# Patient Record
Sex: Female | Born: 1990 | Race: Black or African American | Hispanic: No | Marital: Single | Smoking: Never smoker
Health system: Southern US, Community
[De-identification: ages and names within clinical notes are randomized; demographics above are authoritative.]

## PROBLEM LIST (undated history)

## (undated) DIAGNOSIS — O24419 Gestational diabetes mellitus in pregnancy, unspecified control: Secondary | ICD-10-CM

## (undated) HISTORY — PX: TUBAL LIGATION: SHX77

---

## 2017-12-15 ENCOUNTER — Encounter: Payer: Self-pay | Admitting: Family Medicine

## 2017-12-15 ENCOUNTER — Ambulatory Visit (INDEPENDENT_AMBULATORY_CARE_PROVIDER_SITE_OTHER): Payer: Medicaid Other

## 2017-12-15 VITALS — Wt 208.6 lb

## 2017-12-15 DIAGNOSIS — Z3200 Encounter for pregnancy test, result unknown: Secondary | ICD-10-CM

## 2017-12-15 DIAGNOSIS — Z3201 Encounter for pregnancy test, result positive: Secondary | ICD-10-CM

## 2017-12-15 NOTE — Progress Notes (Addendum)
Pt is here for a UPT. Has not taken one at home , UPT Positive in office LMP 09/23/17, EDD 06/30/2018 [id number] Pt reports taking no medicines or vitamins, Advised to start prenatal vitamins and OB care , Pt verbalized understanding. Pt had no questions

## 2017-12-18 LAB — POCT PREGNANCY, URINE: PREG TEST UR: POSITIVE — AB

## 2017-12-26 ENCOUNTER — Ambulatory Visit (INDEPENDENT_AMBULATORY_CARE_PROVIDER_SITE_OTHER): Payer: Medicaid Other | Admitting: Advanced Practice Midwife

## 2017-12-26 ENCOUNTER — Encounter: Payer: Self-pay | Admitting: Advanced Practice Midwife

## 2017-12-26 ENCOUNTER — Other Ambulatory Visit (HOSPITAL_COMMUNITY)
Admission: RE | Admit: 2017-12-26 | Discharge: 2017-12-26 | Disposition: A | Discharge status: Home / Self Care | Payer: Medicaid Other | Source: Ambulatory Visit | Attending: Advanced Practice Midwife | Admitting: Advanced Practice Midwife

## 2017-12-26 ENCOUNTER — Ambulatory Visit: Payer: Medicaid Other | Admitting: Clinical

## 2017-12-26 ENCOUNTER — Ambulatory Visit: Payer: Self-pay

## 2017-12-26 VITALS — BP 119/70 | HR 98 | Wt 208.0 lb

## 2017-12-26 DIAGNOSIS — Z3480 Encounter for supervision of other normal pregnancy, unspecified trimester: Secondary | ICD-10-CM

## 2017-12-26 DIAGNOSIS — Z98891 History of uterine scar from previous surgery: Secondary | ICD-10-CM | POA: Insufficient documentation

## 2017-12-26 DIAGNOSIS — O26849 Uterine size-date discrepancy, unspecified trimester: Secondary | ICD-10-CM

## 2017-12-26 DIAGNOSIS — O26841 Uterine size-date discrepancy, first trimester: Secondary | ICD-10-CM

## 2017-12-26 DIAGNOSIS — Z3482 Encounter for supervision of other normal pregnancy, second trimester: Secondary | ICD-10-CM | POA: Insufficient documentation

## 2017-12-26 DIAGNOSIS — Z3481 Encounter for supervision of other normal pregnancy, first trimester: Secondary | ICD-10-CM

## 2017-12-26 NOTE — Progress Notes (Signed)
Subjective:   Brooke Gregory is a 27 y.o. G3P2002 at 16 weeks 5 days by midtrimester ultrasound being seen today for her first obstetrical visit.  Her obstetrical history is significant for previous c-section x 2, GDM with prior pregnancy . Patient does intend to breast feed. Pregnancy history fully reviewed.  Patient reports no complaints.  HISTORY: OB History  Gravida Para Term Preterm AB Living  3 2 2  0 0 2  SAB TAB Ectopic Multiple Live Births  0 0 0 0 2    # Outcome Date GA Lbr Len/2nd Weight Sex Delivery Anes PTL Lv  3 Current           2 Term 06/27/16 [redacted]w[redacted]d  10 lb 5 oz (4.678 kg) F CS-LTranv   LIV     Name: Brooke Gregory   1 Term 10/09/07 [redacted]w[redacted]d  8 lb (3.629 kg) M CS-LTranv EPI  LIV     Name: Brooke Gregory     Last pap smear was done unsure, approx 2+years ago and was normal per patient   History reviewed. No pertinent past medical history. Past Surgical History:  Procedure Laterality Date  . CESAREAN SECTION     History reviewed. No pertinent family history. Social History   Tobacco Use  . Smoking status: Never Smoker  . Smokeless tobacco: Never Used  Substance Use Topics  . Alcohol use: Never    Frequency: Never  . Drug use: Never   Not on File Current Outpatient Medications on File Prior to Visit  Medication Sig Dispense Refill  . Prenatal Vit-Fe Fumarate-FA (MULTIVITAMIN-PRENATAL) 27-0.8 MG TABS tablet Take 1 tablet by mouth daily at 12 noon.     No current facility-administered medications on file prior to visit.     Review of Systems Pertinent items noted in HPI and remainder of comprehensive ROS otherwise negative.  Exam   Vitals:   12/26/17 1105  BP: 119/70  Pulse: 98  Weight: 208 lb (94.3 kg)   Fetal Heart Rate (bpm): 158  Uterus:     Pelvic Exam: Perineum: no hemorrhoids, normal perineum   Vulva: normal external genitalia, no lesions   Vagina:  normal mucosa, normal discharge   Cervix: no lesions and normal, pap smear  done.    Adnexa: normal adnexa and no mass, fullness, tenderness   Bony Pelvis: average  System: General: well-developed, well-nourished female in no acute distress   Breast:  normal appearance, no masses or tenderness   Skin: normal coloration and turgor, no rashes   Neurologic: oriented, normal, negative, normal mood   Extremities: normal strength, tone, and muscle mass, ROM of all joints is normal   HEENT PERRLA, extraocular movement intact and sclera clear, anicteric   Mouth/Teeth mucous membranes moist, pharynx normal without lesions and dental hygiene good   Neck supple and no masses   Cardiovascular: regular rate and rhythm   Respiratory:  no respiratory distress, normal breath sounds   Abdomen: soft, non-tender; bowel sounds normal; no masses,  no organomegaly    Assessment:   Pregnancy: [id number] Patient Active Problem List   Diagnosis Date Noted  . Supervision of other normal pregnancy, antepartum 12/26/2017  . Previous cesarean section x 2 12/26/2017     Plan:  1. Supervision of other normal pregnancy, antepartum - Routine care  - Obstetric Panel, Including HIV - Genetic Screening - Cystic Fibrosis Mutation 97 - SMN1 Copy Number Analysis - Hemoglobinopathy Evaluation - Cytology - PAP - Culture, OB Urine - [place] MFM  OB COMP + 14 WK; Future - HgB A1c - AFP only   2. Significant discrepancy between uterine size and clinical dates, antepartum - US OB Limited; Future  3. Previous cesarean section x 2 - Plan repeat    Initial labs drawn. Continue prenatal vitamins. Genetic Screening discussed, AFP and NIPS: requested. Ultrasound discussed; fetal anatomic survey: ordered. Problem list reviewed and updated. The nature of [facility] with multiple MDs and other Advanced Practice Providers was explained to patient; also emphasized that residents, students are part of our team. Routine obstetric precautions reviewed. 50% of 45 min  visit spent in counseling and coordination of care. Return in about 4 weeks (around 01/23/2018).

## 2017-12-26 NOTE — Patient Instructions (Signed)
   Genetic Screening Results Information: You are having genetic testing called [facility] today.  It will take approximately 2 weeks before the results are available.  To get your results, you need Internet access to a web browser to search [place]/MyChart (the direct app on your phone will not give you these results).  Then select Lab Scanned and click on the blue hyper link that says View Image to see your [facility] results.  You can also use the directions on the purple card given to look up your results directly on the [facility] website.  

## 2017-12-26 NOTE — BH Specialist Note (Signed)
Integrated Behavioral Health Initial Visit  MRN: [id number] Name: Brooke Gregory  Number of Integrated Behavioral Health Clinician visits:: 1/6 Session Start time: 10:24  Session End time: 10:33 Total time: 8 minutes  Type of Service: Integrated Behavioral Health- Individual/Family Interpretor:No. Interpretor Name and Language: n/a   Warm Hand Off Completed.       SUBJECTIVE: Brooke Gregory is a 27 y.o. female accompanied by n/a Patient was referred by Thressa Sheller, CNM for Initial OB introduction to integrated behavioral health services . Patient reports the following symptoms/concerns: Pt states no particular concern today. Duration of problem: n/a; Severity of problem: n/a  OBJECTIVE: Mood: Normal and Affect: Appropriate Risk of harm to self or others: No plan to harm self or others  LIFE CONTEXT: Family and Social: Pt has 28yo School/Work: - Self-Care: - Life Changes: Current pregnancy  GOALS ADDRESSED: n/a  INTERVENTIONS:   Standardized Assessments completed: GAD-7 and PHQ 9  ASSESSMENT: Patient currently experiencing Supervision of other normal pregnancy, antepartum.   Patient may benefit from Initial OB introduction to integrated behavioral health services .  PLAN: 1. Follow up with behavioral health clinician on : As needed 2. Behavioral recommendations:  -Continue taking prenatal vitamin as recommended by medical provider 3. Referral(s): Integrated [employer] (In Clinic) 4. "From scale of 1-10, how likely are you to follow plan?": 10  Rae Lips, LCSW  Depression screen [facility] 2/9 12/26/2017  Decreased Interest 1  Down, Depressed, Hopeless 1  PHQ - 2 Score 2  Altered sleeping 0  Tired, decreased energy 1  Change in appetite 2  Feeling bad or failure about yourself  1  Trouble concentrating 0  Moving slowly or fidgety/restless 0  Suicidal thoughts 0  PHQ-9 Score 6   GAD 7 : Generalized Anxiety Score 12/26/2017  Nervous,  Anxious, on Edge 0  Control/stop worrying 0  Worry too much - different things 0  Trouble relaxing 0  Restless 0  Afraid - awful might happen 0

## 2017-12-28 LAB — URINE CULTURE, OB REFLEX

## 2017-12-28 LAB — CULTURE, OB URINE

## 2017-12-29 LAB — CYTOLOGY - PAP
CHLAMYDIA, DNA PROBE: NEGATIVE
Diagnosis: NEGATIVE
Neisseria Gonorrhea: NEGATIVE

## 2017-12-29 MED ORDER — NITROFURANTOIN MONOHYD MACRO 100 MG PO CAPS: 100.0000 mg | ORAL_CAPSULE | Freq: Two times a day (BID) | ORAL | 0 refills | Status: DC

## 2017-12-29 NOTE — Addendum Note (Signed)
Addended by: Thressa Sheller D on: 12/29/2017 09:30 PM   Modules accepted: Orders

## 2018-01-01 ENCOUNTER — Encounter: Payer: Self-pay | Admitting: *Deleted

## 2018-01-02 ENCOUNTER — Telehealth: Payer: Self-pay

## 2018-01-02 NOTE — Telephone Encounter (Signed)
LM for pt that she has a UTI and that an antibiotic has been sent to her [facility] pharmacy on [address]. And we also sent a MyChart message.

## 2018-01-02 NOTE — Telephone Encounter (Signed)
-----   Message from Brooke Gregory, CNM sent at 12/29/2017  9:29 PM EDT ----- Patient has UTI. I have sent in rx for her. Please call.

## 2018-01-03 LAB — HEMOGLOBINOPATHY EVALUATION
Ferritin: 36 ng/mL (ref 15–150)
HGB A2 QUANT: 2.5 % (ref 1.8–3.2)
HGB S: 0 %
HGB SOLUBILITY: NEGATIVE
HGB VARIANT: 0 %
Hgb A: 97.5 % (ref 96.4–98.8)
Hgb C: 0 %
Hgb F Quant: 0 % (ref 0.0–2.0)

## 2018-01-03 LAB — OBSTETRIC PANEL, INCLUDING HIV
ANTIBODY SCREEN: NEGATIVE
BASOS: 0 %
Basophils Absolute: 0 10*3/uL (ref 0.0–0.2)
EOS (ABSOLUTE): 0.2 10*3/uL (ref 0.0–0.4)
EOS: 2 %
HEMATOCRIT: 32.6 % — AB (ref 34.0–46.6)
HEMOGLOBIN: 10.7 g/dL — AB (ref 11.1–15.9)
HEP B S AG: NEGATIVE
HIV Screen 4th Generation wRfx: NONREACTIVE
IMMATURE GRANULOCYTES: 0 %
Immature Grans (Abs): 0 10*3/uL (ref 0.0–0.1)
LYMPHS ABS: 2.2 10*3/uL (ref 0.7–3.1)
Lymphs: 20 %
MCH: 27 pg (ref 26.6–33.0)
MCHC: 32.8 g/dL (ref 31.5–35.7)
MCV: 82 fL (ref 79–97)
Monocytes Absolute: 0.6 10*3/uL (ref 0.1–0.9)
Monocytes: 6 %
NEUTROS PCT: 72 %
Neutrophils Absolute: 8.1 10*3/uL — ABNORMAL HIGH (ref 1.4–7.0)
Platelets: 401 10*3/uL (ref 150–450)
RBC: 3.96 x10E6/uL (ref 3.77–5.28)
RDW: 15.5 % — ABNORMAL HIGH (ref 12.3–15.4)
RH TYPE: POSITIVE
RPR: NONREACTIVE
Rubella Antibodies, IGG: 1.98 index (ref >0.99)
WBC: 11.3 10*3/uL — ABNORMAL HIGH (ref 3.4–10.8)

## 2018-01-03 LAB — SMN1 COPY NUMBER ANALYSIS (SMA CARRIER SCREENING)

## 2018-01-03 LAB — AFP, SERUM, OPEN SPINA BIFIDA
AFP MOM: 1.11
AFP VALUE AFPOSL: 35.4 ng/mL
Gest. Age on Collection Date: 16.7 weeks
Maternal Age At EDD: 28.2 yr
OSBR RISK 1 IN: 10000
Test Results:: NEGATIVE
WEIGHT: 208 [lb_av]

## 2018-01-03 LAB — CYSTIC FIBROSIS MUTATION 97: Interpretation: NOT DETECTED

## 2018-01-03 LAB — HEMOGLOBIN A1C
Est. average glucose Bld gHb Est-mCnc: 114 mg/dL
Hgb A1c MFr Bld: 5.6 % (ref 4.8–5.6)

## 2018-01-05 ENCOUNTER — Encounter (HOSPITAL_COMMUNITY): Payer: Self-pay

## 2018-01-11 ENCOUNTER — Ambulatory Visit (HOSPITAL_COMMUNITY)
Admission: RE | Admit: 2018-01-11 | Discharge: 2018-01-11 | Disposition: A | Discharge status: Home / Self Care | Payer: Medicaid Other | Source: Ambulatory Visit | Attending: Advanced Practice Midwife | Admitting: Advanced Practice Midwife

## 2018-01-11 ENCOUNTER — Other Ambulatory Visit: Payer: Self-pay | Admitting: Advanced Practice Midwife

## 2018-01-11 ENCOUNTER — Other Ambulatory Visit (HOSPITAL_COMMUNITY): Payer: Self-pay | Admitting: *Deleted

## 2018-01-11 DIAGNOSIS — O99212 Obesity complicating pregnancy, second trimester: Secondary | ICD-10-CM

## 2018-01-11 DIAGNOSIS — Z363 Encounter for antenatal screening for malformations: Secondary | ICD-10-CM | POA: Diagnosis present

## 2018-01-11 DIAGNOSIS — Z3480 Encounter for supervision of other normal pregnancy, unspecified trimester: Secondary | ICD-10-CM

## 2018-01-11 DIAGNOSIS — O34219 Maternal care for unspecified type scar from previous cesarean delivery: Secondary | ICD-10-CM | POA: Diagnosis not present

## 2018-01-11 DIAGNOSIS — O09292 Supervision of pregnancy with other poor reproductive or obstetric history, second trimester: Secondary | ICD-10-CM

## 2018-01-11 DIAGNOSIS — Z362 Encounter for other antenatal screening follow-up: Secondary | ICD-10-CM

## 2018-01-11 DIAGNOSIS — Z3A18 18 weeks gestation of pregnancy: Secondary | ICD-10-CM

## 2018-01-23 ENCOUNTER — Encounter: Payer: Medicaid Other | Admitting: Advanced Practice Midwife

## 2018-02-06 ENCOUNTER — Ambulatory Visit (INDEPENDENT_AMBULATORY_CARE_PROVIDER_SITE_OTHER): Payer: Medicaid Other | Admitting: Advanced Practice Midwife

## 2018-02-06 VITALS — BP 128/82 | HR 99 | Wt 215.2 lb

## 2018-02-06 DIAGNOSIS — Z3480 Encounter for supervision of other normal pregnancy, unspecified trimester: Secondary | ICD-10-CM

## 2018-02-06 DIAGNOSIS — R102 Pelvic and perineal pain: Secondary | ICD-10-CM

## 2018-02-06 DIAGNOSIS — Z3482 Encounter for supervision of other normal pregnancy, second trimester: Secondary | ICD-10-CM | POA: Diagnosis not present

## 2018-02-06 DIAGNOSIS — O26892 Other specified pregnancy related conditions, second trimester: Secondary | ICD-10-CM

## 2018-02-06 DIAGNOSIS — Z98891 History of uterine scar from previous surgery: Secondary | ICD-10-CM

## 2018-02-06 MED ORDER — COMFORT FIT MATERNITY SUPP MED MISC: 1.0000 | Freq: Every day | 0 refills | Status: DC

## 2018-02-06 NOTE — Progress Notes (Signed)
PRENATAL VISIT NOTE  Subjective:  Brooke Gregory is a 27 y.o. G3P2002 at [redacted]w[redacted]d being seen today for ongoing prenatal care.  She is currently monitored for the following issues for this low-risk pregnancy and has Supervision of other normal pregnancy, antepartum and Previous cesarean section x 2 on their problem list.   Patient reports pelvic/hip pain. It occurs intermittently and seems to be worse when lying down. She has tried to use a pillow in between her legs to relieve some of the pain with little relief. This is her third pregnancy, with two prior C-sections. She denies leaking of fluid, vaginal bleeding, or contractions. She has had some movement of the baby.  The following portions of the patient's history were reviewed and updated as appropriate: allergies, current medications, past family history, past medical history, past social history, past surgical history and problem list. Problem list updated.  Objective:   Vitals:   02/06/18 1415  BP: 128/82  Pulse: 99  Weight: 215 lb 3.2 oz (97.6 kg)    Fetal Status: Fetal Heart Rate (bpm): 141   Movement: Present     General:  Alert, oriented and cooperative. Patient is in no acute distress.  Skin: Skin is warm and dry. No rash noted.   Cardiovascular: Normal heart rate noted  Respiratory: Normal respiratory effort, no problems with respiration noted  Abdomen: Soft, gravid, appropriate for gestational age.  Pain/Pressure: Present     Pelvic: Cervical exam deferred        Extremities: Normal range of motion.  Edema: None  Mental Status: Normal mood and affect. Normal behavior. Normal judgment and thought content.   Assessment and Plan:  Pregnancy: G3P2002 at [redacted]w[redacted]d  1. Supervision of other normal pregnancy, antepartum -Routine care, initial testing already completed  2. Previous cesarean section x 2 -Planned as before.  3. Pelvic pain affecting pregnancy in second trimester, antepartum - Elastic Bandages & Supports (COMFORT  FIT MATERNITY SUPP MED) MISC; 1 Device by Does not apply route daily.  Dispense: 1 each; Refill: 0 -Patient told to return if the pain becomes worse. Continue using the pillow in between her legs while sleeping. Use ice, heat, Tylenol as needed. Start wearing support belt.  Term labor symptoms and general obstetric precautions including but not limited to vaginal bleeding, contractions, leaking of fluid and fetal movement were reviewed in detail with the patient. Please refer to After Visit Summary for other counseling recommendations.  No follow-ups on file.  Future Appointments  Date Time Provider Department Center  02/08/2018  8:15 AM [facility]-M[facility] [place] 4 WH-MFCUS MFC-US  03/06/2018 11:15 AM Katrinka Blazing, [place], CNM WOC-WOCA WOC    [place], [place]

## 2018-02-06 NOTE — Patient Instructions (Addendum)
Second Trimester of Pregnancy The second trimester is from week 14 through week 27 (months 4 through 6). The second trimester is often a time when you feel your best. Your body has adjusted to being pregnant, and you begin to feel better physically. Usually, morning sickness has lessened or quit completely, you may have more energy, and you may have an increase in appetite. The second trimester is also a time when the fetus is growing rapidly. At the end of the sixth month, the fetus is about 9 inches long and weighs about 1 pounds. You will likely begin to feel the baby move (quickening) between 16 and 20 weeks of pregnancy. Body changes during your second trimester Your body continues to go through many changes during your second trimester. The changes vary from woman to woman.  Your weight will continue to increase. You will notice your lower abdomen bulging out.  You may begin to get stretch marks on your hips, abdomen, and breasts.  You may develop headaches that can be relieved by medicines. The medicines should be approved by your health care provider.  You may urinate more often because the fetus is pressing on your bladder.  You may develop or continue to have heartburn as a result of your pregnancy.  You may develop constipation because certain hormones are causing the muscles that push waste through your intestines to slow down.  You may develop hemorrhoids or swollen, bulging veins (varicose veins).  You may have back pain. This is caused by: ? Weight gain. ? Pregnancy hormones that are relaxing the joints in your pelvis. ? A shift in weight and the muscles that support your balance.  Your breasts will continue to grow and they will continue to become tender.  Your gums may bleed and may be sensitive to brushing and flossing.  Dark spots or blotches (chloasma, mask of pregnancy) may develop on your face. This will likely fade after the baby is born.  A dark line from your  belly button to the pubic area (linea nigra) may appear. This will likely fade after the baby is born.  You may have changes in your hair. These can include thickening of your hair, rapid growth, and changes in texture. Some women also have hair loss during or after pregnancy, or hair that feels dry or thin. Your hair will most likely return to normal after your baby is born.  What to expect at prenatal visits During a routine prenatal visit:  You will be weighed to make sure you and the fetus are growing normally.  Your blood pressure will be taken.  Your abdomen will be measured to track your baby's growth.  The fetal heartbeat will be listened to.  Any test results from the previous visit will be discussed.  Your health care provider may ask you:  How you are feeling.  If you are feeling the baby move.  If you have had any abnormal symptoms, such as leaking fluid, bleeding, severe headaches, or abdominal cramping.  If you are using any tobacco products, including cigarettes, chewing tobacco, and electronic cigarettes.  If you have any questions.  Other tests that may be performed during your second trimester include:  Blood tests that check for: ? Low iron levels (anemia). ? High blood sugar that affects pregnant women (gestational diabetes) between 24 and 28 weeks. ? Rh antibodies. This is to check for a protein on red blood cells (Rh factor).  Urine tests to check for infections, diabetes, or   protein in the urine.  An ultrasound to confirm the proper growth and development of the baby.  An amniocentesis to check for possible genetic problems.  Fetal screens for spina bifida and Down syndrome.  HIV (human immunodeficiency virus) testing. Routine prenatal testing includes screening for HIV, unless you choose not to have this test.  Follow these instructions at home: Medicines  Follow your health care provider's instructions regarding medicine use. Specific  medicines may be either safe or unsafe to take during pregnancy.  Take a prenatal vitamin that contains at least 600 micrograms (mcg) of folic acid.  If you develop constipation, try taking a stool softener if your health care provider approves. Eating and drinking  Eat a balanced diet that includes fresh fruits and vegetables, whole grains, good sources of protein such as meat, eggs, or tofu, and low-fat dairy. Your health care provider will help you determine the amount of weight gain that is right for you.  Avoid raw meat and uncooked cheese. These carry germs that can cause birth defects in the baby.  If you have low calcium intake from food, talk to your health care provider about whether you should take a daily calcium supplement.  Limit foods that are high in fat and processed sugars, such as fried and sweet foods.  To prevent constipation: ? Drink enough fluid to keep your urine clear or pale yellow. ? Eat foods that are high in fiber, such as fresh fruits and vegetables, whole grains, and beans. Activity  Exercise only as directed by your health care provider. Most women can continue their usual exercise routine during pregnancy. Try to exercise for 30 minutes at least 5 days a week. Stop exercising if you experience uterine contractions.  Avoid heavy lifting, wear low heel shoes, and practice good posture.  A sexual relationship may be continued unless your health care provider directs you otherwise. Relieving pain and discomfort  Wear a good support bra to prevent discomfort from breast tenderness.  Take warm sitz baths to soothe any pain or discomfort caused by hemorrhoids. Use hemorrhoid cream if your health care provider approves.  Rest with your legs elevated if you have leg cramps or low back pain.  If you develop varicose veins, wear support hose. Elevate your feet for 15 minutes, 3-4 times a day. Limit salt in your diet. Prenatal Care  Write down your questions.  Take them to your prenatal visits.  Keep all your prenatal visits as told by your health care provider. This is important. Safety  Wear your seat belt at all times when driving.  Make a list of emergency phone numbers, including numbers for family, friends, the hospital, and police and fire departments. General instructions  Ask your health care provider for a referral to a local prenatal education class. Begin classes no later than the beginning of month 6 of your pregnancy.  Ask for help if you have counseling or nutritional needs during pregnancy. Your health care provider can offer advice or refer you to specialists for help with various needs.  Do not use hot tubs, steam rooms, or saunas.  Do not douche or use tampons or scented sanitary pads.  Do not cross your legs for long periods of time.  Avoid cat litter boxes and soil used by cats. These carry germs that can cause birth defects in the baby and possibly loss of the fetus by miscarriage or stillbirth.  Avoid all smoking, herbs, alcohol, and unprescribed drugs. Chemicals in these products can   affect the formation and growth of the baby.  Do not use any products that contain nicotine or tobacco, such as cigarettes and e-cigarettes. If you need help quitting, ask your health care provider.  Visit your dentist if you have not gone yet during your pregnancy. Use a soft toothbrush to brush your teeth and be gentle when you floss. Contact a health care provider if:  You have dizziness.  You have mild pelvic cramps, pelvic pressure, or nagging pain in the abdominal area.  You have persistent nausea, vomiting, or diarrhea.  You have a bad smelling vaginal discharge.  You have pain when you urinate. Get help right away if:  You have a fever.  You are leaking fluid from your vagina.  You have spotting or bleeding from your vagina.  You have severe abdominal cramping or pain.  You have rapid weight gain or weight  loss.  You have shortness of breath with chest pain.  You notice sudden or extreme swelling of your face, hands, ankles, feet, or legs.  You have not felt your baby move in over an hour.  You have severe headaches that do not go away when you take medicine.  You have vision changes. Summary  The second trimester is from week 14 through week 27 (months 4 through 6). It is also a time when the fetus is growing rapidly.  Your body goes through many changes during pregnancy. The changes vary from woman to woman.  Avoid all smoking, herbs, alcohol, and unprescribed drugs. These chemicals affect the formation and growth your baby.  Do not use any tobacco products, such as cigarettes, chewing tobacco, and e-cigarettes. If you need help quitting, ask your health care provider.  Contact your health care provider if you have any questions. Keep all prenatal visits as told by your health care provider. This is important. This information is not intended to replace advice given to you by your health care provider. Make sure you discuss any questions you have with your health care provider. Document Released: 04/05/2001 Document Revised: 05/17/2016 Document Reviewed: 05/17/2016 Elsevier Interactive Patient Education  2018 [employer] Inc[employer]    PREGNANCY SUPPORT BELT: You are not alone, Seventy-five percent of women have some sort of abdominal or back pain at some point in their pregnancy. Your baby is growing at a fast pace, which means that your whole body is rapidly trying to adjust to the changes. As your uterus grows, your back may start feeling a bit under stress and this can result in back or abdominal pain that can go from mild, and therefore bearable, to severe pains that will not allow you to sit or lay down comfortably, When it comes to dealing with pregnancy-related pains and cramps, some pregnant women usually prefer natural remedies, which the market is filled with nowadays. For example,  wearing a pregnancy support belt can help ease and lessen your discomfort and pain. WHAT ARE THE BENEFITS OF WEARING A PREGNANCY SUPPORT BELT? A pregnancy support belt provides support to the lower portion of the belly taking some of the weight of the growing uterus and distributing to the other parts of your body. It is designed make you comfortable and gives you extra support. Over the years, the pregnancy apparel market has been studying the needs and wants of pregnant women and they have come up with the most comfortable pregnancy support belts that woman could ever ask for. In fact, you will no longer have to wear a stretched-out or bulky   pregnancy belt that is visible underneath your clothes and makes you feel even more uncomfortable. Nowadays, a pregnancy support belt is made of comfortable and stretchy materials that will not irritate your skin but will actually make you feel at ease and you will not even notice you are wearing it. They are easy to put on and adjust during the day and can be worn at night for additional support.  BENEFITS: . Relives Back pain . Relieves Abdominal Muscle and Leg Pain . Stabilizes the Pelvic Ring . Offers a Cushioned Abdominal Lift Pad . Relieves pressure on the Sciatic Nerve Within Minutes WHERE TO GET YOUR PREGNANCY BELT: [employer] [phone] @[address] [place], [place] [address]  

## 2018-02-06 NOTE — Progress Notes (Signed)
   PRENATAL VISIT NOTE  Subjective:  Brooke Gregory is a 27 y.o. G3P2002 at [redacted]w[redacted]d being seen today for ongoing prenatal care.  She is currently monitored for the following issues for this low-risk pregnancy and has Supervision of other normal pregnancy, antepartum and Previous cesarean section x 2 on their problem list.  Patient reports no complaints.  Contractions: Not present. Vag. Bleeding: None.  Movement: Present. Denies leaking of fluid.   The following portions of the patient's history were reviewed and updated as appropriate: allergies, current medications, past family history, past medical history, past social history, past surgical history and problem list. Problem list updated.  Objective:   Vitals:   02/06/18 1415  BP: 128/82  Pulse: 99  Weight: 97.6 kg    Fetal Status: Fetal Heart Rate (bpm): 141   Movement: Present     General:  Alert, oriented and cooperative. Patient is in no acute distress.  Skin: Skin is warm and dry. No rash noted.   Cardiovascular: Normal heart rate noted  Respiratory: Normal respiratory effort, no problems with respiration noted  Abdomen: Soft, gravid, appropriate for gestational age.  Pain/Pressure: Present     Pelvic: Cervical exam deferred        Extremities: Normal range of motion.  Edema: None  Mental Status: Normal mood and affect. Normal behavior. Normal judgment and thought content.   Assessment and Plan:  Pregnancy: G3P2002 at [redacted]w[redacted]d  1. Supervision of other normal pregnancy, antepartum --Anticipatory guidance about next visits/weeks of pregnancy given.  2. Previous cesarean section x 2 --Plans repeat  3. Pelvic pain affecting pregnancy in second trimester, antepartum --Pain c/w round ligament pain and pubic symphysis pain.   --Rest/ice/heat/warm bath/Tylenol/Pregnancy support belt - Elastic Bandages & Supports (COMFORT FIT MATERNITY SUPP MED) MISC; 1 Device by Does not apply route daily.  Dispense: 1 each; Refill: 0  Preterm  labor symptoms and general obstetric precautions including but not limited to vaginal bleeding, contractions, leaking of fluid and fetal movement were reviewed in detail with the patient. Please refer to After Visit Summary for other counseling recommendations.  No follow-ups on file.  Future Appointments  Date Time Provider Department Center  02/08/2018  8:15 AM WH-MFC [place] 4 WH-MFCUS MFC-US  03/06/2018 11:15 AM Katrinka Blazing, [place], CNM WOC-WOCA WOC    [place]aren Counter, [place]

## 2018-02-06 NOTE — Progress Notes (Signed)
Pt states is having a lot of pelvic pain. 

## 2018-02-08 ENCOUNTER — Ambulatory Visit (HOSPITAL_COMMUNITY): Payer: Medicaid Other

## 2018-02-22 ENCOUNTER — Ambulatory Visit (HOSPITAL_COMMUNITY): Payer: Medicaid Other

## 2018-02-28 ENCOUNTER — Ambulatory Visit (HOSPITAL_COMMUNITY)
Admission: RE | Admit: 2018-02-28 | Discharge: 2018-02-28 | Disposition: A | Discharge status: Home / Self Care | Payer: Medicaid Other | Source: Ambulatory Visit | Attending: Advanced Practice Midwife | Admitting: Advanced Practice Midwife

## 2018-02-28 DIAGNOSIS — Z3A25 25 weeks gestation of pregnancy: Secondary | ICD-10-CM | POA: Diagnosis not present

## 2018-02-28 DIAGNOSIS — Z362 Encounter for other antenatal screening follow-up: Secondary | ICD-10-CM | POA: Diagnosis not present

## 2018-02-28 DIAGNOSIS — O99212 Obesity complicating pregnancy, second trimester: Secondary | ICD-10-CM | POA: Insufficient documentation

## 2018-02-28 DIAGNOSIS — O99512 Diseases of the respiratory system complicating pregnancy, second trimester: Secondary | ICD-10-CM

## 2018-02-28 DIAGNOSIS — O34219 Maternal care for unspecified type scar from previous cesarean delivery: Secondary | ICD-10-CM | POA: Diagnosis not present

## 2018-02-28 DIAGNOSIS — O09292 Supervision of pregnancy with other poor reproductive or obstetric history, second trimester: Secondary | ICD-10-CM

## 2018-03-06 ENCOUNTER — Ambulatory Visit (INDEPENDENT_AMBULATORY_CARE_PROVIDER_SITE_OTHER): Payer: Medicaid Other | Admitting: Student

## 2018-03-06 ENCOUNTER — Encounter: Payer: Medicaid Other | Admitting: Advanced Practice Midwife

## 2018-03-06 VITALS — BP 124/81 | HR 103 | Ht 61.0 in | Wt 218.0 lb

## 2018-03-06 DIAGNOSIS — Z3480 Encounter for supervision of other normal pregnancy, unspecified trimester: Secondary | ICD-10-CM

## 2018-03-06 DIAGNOSIS — N390 Urinary tract infection, site not specified: Secondary | ICD-10-CM | POA: Insufficient documentation

## 2018-03-06 DIAGNOSIS — Z8632 Personal history of gestational diabetes: Secondary | ICD-10-CM

## 2018-03-06 DIAGNOSIS — O09299 Supervision of pregnancy with other poor reproductive or obstetric history, unspecified trimester: Secondary | ICD-10-CM

## 2018-03-06 DIAGNOSIS — N3000 Acute cystitis without hematuria: Secondary | ICD-10-CM

## 2018-03-06 DIAGNOSIS — Z98891 History of uterine scar from previous surgery: Secondary | ICD-10-CM

## 2018-03-06 MED ORDER — FERROUS SULFATE 325 (65 FE) MG PO TABS: 325.0000 mg | ORAL_TABLET | Freq: Every day | ORAL | 3 refills | Status: DC

## 2018-03-06 NOTE — Progress Notes (Signed)
    PRENATAL VISIT NOTE  Subjective:  Brooke Gregory is a 27 y.o. G3P2002 at [redacted]w[redacted]d being seen today for ongoing prenatal care.  She is currently monitored for the following issues for this low-risk pregnancy and has Supervision of other normal pregnancy, antepartum; History of C-section; UTI (urinary tract infection); and History of macrosomia in infant in prior pregnancy, currently pregnant on their problem list.  Patient reports no complaints.  Contractions: Not present. Vag. Bleeding: None.  Movement: Present. Denies leaking of fluid.   The following portions of the patient's history were reviewed and updated as appropriate: allergies, current medications, past family history, past medical history, past social history, past surgical history and problem list. Problem list updated.  Objective:   Vitals:   03/06/18 1120 03/06/18 1124  BP: 124/81   Pulse: (!) 103   Weight: 218 lb (98.9 kg)   Height:  5\' 1"  (1.549 m)    Fetal Status: Fetal Heart Rate (bpm): 144 Fundal Height: 30 cm Movement: Present     General:  Alert, oriented and cooperative. Patient is in no acute distress.  Skin: Skin is warm and dry. No rash noted.   Cardiovascular: Normal heart rate noted  Respiratory: Normal respiratory effort, no problems with respiration noted  Abdomen: Soft, gravid, appropriate for gestational age.  Pain/Pressure: Present     Pelvic: Cervical exam deferred        Extremities: Normal range of motion.  Edema: None  Mental Status: Normal mood and affect. Normal behavior. Normal judgment and thought content.   Assessment and Plan:  Pregnancy: G3P2002 at [redacted]w[redacted]d  1. History of gestational diabetes -2 hour next visit  2. Acute cystitis without hematuria -Patient completed course of abx; no s/s; will do follow up culture today - Culture, OB Urine  3. History of macrosomia in infant in prior pregnancy, currently pregnant -consider growth scan if FH is large; measures 30 cm today but  difficult to assess without pannus.  -last [place] on 11-6 was EFW in 68%  4. History of C-section -plans repeat  5. Supervision of other normal pregnancy, antepartum -reviewed labs; patient to start iron once a day, gave education on how to take and side effects   Preterm labor symptoms and general obstetric precautions including but not limited to vaginal bleeding, contractions, leaking of fluid and fetal movement were reviewed in detail with the patient. Please refer to After Visit Summary for other counseling recommendations.  Return in about 2 weeks (around 03/20/2018), or 2 hour gtt.  Future Appointments  Date Time Provider Department Center  03/20/2018  8:35 AM Brooke Gregory, [place] [facility]  03/20/2018  9:30 AM [facility]    [place]oke [place], [place]

## 2018-03-06 NOTE — Patient Instructions (Signed)

## 2018-03-11 LAB — CULTURE, OB URINE

## 2018-03-11 LAB — URINE CULTURE, OB REFLEX

## 2018-03-13 ENCOUNTER — Other Ambulatory Visit: Payer: Self-pay | Admitting: Student

## 2018-03-13 ENCOUNTER — Encounter: Payer: Self-pay | Admitting: Student

## 2018-03-13 DIAGNOSIS — R8271 Bacteriuria: Secondary | ICD-10-CM

## 2018-03-13 DIAGNOSIS — A491 Streptococcal infection, unspecified site: Secondary | ICD-10-CM | POA: Insufficient documentation

## 2018-03-13 DIAGNOSIS — N3000 Acute cystitis without hematuria: Secondary | ICD-10-CM

## 2018-03-13 MED ORDER — AMOXICILLIN 875 MG PO TABS: 875.0000 mg | ORAL_TABLET | Freq: Two times a day (BID) | ORAL | 0 refills | Status: DC

## 2018-03-19 ENCOUNTER — Other Ambulatory Visit: Payer: Self-pay | Admitting: *Deleted

## 2018-03-19 DIAGNOSIS — Z3480 Encounter for supervision of other normal pregnancy, unspecified trimester: Secondary | ICD-10-CM

## 2018-03-20 ENCOUNTER — Encounter: Payer: Medicaid Other | Admitting: Advanced Practice Midwife

## 2018-03-20 ENCOUNTER — Other Ambulatory Visit: Payer: Medicaid Other

## 2018-03-20 NOTE — Progress Notes (Deleted)
   PRENATAL VISIT NOTE  Subjective:  Brooke Gregory is a 27 y.o. G3P2002 at 5230w5d being seen today for ongoing prenatal care.  She is currently monitored for the following issues for this high-risk pregnancy and has Supervision of other normal pregnancy, antepartum; History of C-section; UTI (urinary tract infection); History of macrosomia in infant in prior pregnancy, currently pregnant; GBS bacteriuria; and GBS (group B streptococcus) infection on their problem list.  Patient reports {sx:14538}.   .  .   . Denies leaking of fluid.   The following portions of the patient's history were reviewed and updated as appropriate: allergies, current medications, past family history, past medical history, past social history, past surgical history and problem list. Problem list updated.  Objective:  There were no vitals filed for this visit.  Fetal Status:           General:  Alert, oriented and cooperative. Patient is in no acute distress.  Skin: Skin is warm and dry. No rash noted.   Cardiovascular: Normal heart rate noted  Respiratory: Normal respiratory effort, no problems with respiration noted  Abdomen: Soft, gravid, appropriate for gestational age.        Pelvic: {Blank single:19197::"Cervical exam performed","Cervical exam deferred"}        Extremities: Normal range of motion.     Mental Status: Normal mood and affect. Normal behavior. Normal judgment and thought content.   Assessment and Plan:  Pregnancy: G3P2002 at 2830w5d  1. GBS (group B streptococcus) infection - PCN in labor - TOC Dec  2. History of C-section - Plan repeat  3. History of macrosomia in infant in prior pregnancy, currently pregnant - Consider Growth US ~38 weeks  4. Supervision of other normal pregnancy, antepartum - 28 week labs -***TDaP  Preterm labor symptoms and general obstetric precautions including but not limited to vaginal bleeding, contractions, leaking of fluid and fetal movement were reviewed in  detail with the patient. Please refer to After Visit Summary for other counseling recommendations.  No follow-ups on file.  Future Appointments  Date Time Provider Department Center  03/20/2018  8:35 AM Mora ApplSmith, Raja Liska, [facility]  03/20/2018  9:30 AM [facility]    [place] Mayrin [place]muck, [place]

## 2018-03-26 ENCOUNTER — Ambulatory Visit (INDEPENDENT_AMBULATORY_CARE_PROVIDER_SITE_OTHER): Payer: Medicaid Other | Admitting: Obstetrics & Gynecology

## 2018-03-26 ENCOUNTER — Encounter: Payer: Self-pay | Admitting: Obstetrics & Gynecology

## 2018-03-26 ENCOUNTER — Other Ambulatory Visit: Payer: Medicaid Other

## 2018-03-26 VITALS — BP 120/81 | HR 120

## 2018-03-26 DIAGNOSIS — Z23 Encounter for immunization: Secondary | ICD-10-CM | POA: Diagnosis present

## 2018-03-26 DIAGNOSIS — Z3480 Encounter for supervision of other normal pregnancy, unspecified trimester: Secondary | ICD-10-CM

## 2018-03-26 DIAGNOSIS — Z98891 History of uterine scar from previous surgery: Secondary | ICD-10-CM

## 2018-03-26 DIAGNOSIS — Z3483 Encounter for supervision of other normal pregnancy, third trimester: Secondary | ICD-10-CM

## 2018-03-26 DIAGNOSIS — O09299 Supervision of pregnancy with other poor reproductive or obstetric history, unspecified trimester: Secondary | ICD-10-CM

## 2018-03-26 DIAGNOSIS — O09293 Supervision of pregnancy with other poor reproductive or obstetric history, third trimester: Secondary | ICD-10-CM

## 2018-03-26 NOTE — Progress Notes (Signed)
Pt states a couple of weeks ago experienced heart palpatations & felt hot all over120/81

## 2018-03-26 NOTE — Progress Notes (Signed)
   PRENATAL VISIT NOTE  Subjective:  Brooke Gregory is a 27 y.o. G3P2002 at 6065w4d being seen today for ongoing prenatal care.  She is currently monitored for the following issues for this high-risk pregnancy and has Supervision of other normal pregnancy, antepartum; History of C-section; UTI (urinary tract infection); History of macrosomia in infant in prior pregnancy, currently pregnant; GBS bacteriuria; and GBS (group B streptococcus) infection on their problem list.  Patient reports no complaints.  Contractions: Irritability. Vag. Bleeding: None.  Movement: Present. Denies leaking of fluid.   The following portions of the patient's history were reviewed and updated as appropriate: allergies, current medications, past family history, past medical history, past social history, past surgical history and problem list. Problem list updated.  Objective:   Vitals:   03/26/18 0909  BP: 120/81  Pulse: (!) 120    Fetal Status: Fetal Heart Rate (bpm): 145   Movement: Present     General:  Alert, oriented and cooperative. Patient is in no acute distress.  Skin: Skin is warm and dry. No rash noted.   Cardiovascular: Normal heart rate noted  Respiratory: Normal respiratory effort, no problems with respiration noted  Abdomen: Soft, gravid, appropriate for gestational age.  Pain/Pressure: Present     Pelvic: Cervical exam deferred        Extremities: Normal range of motion.  Edema: Trace  Mental Status: Normal mood and affect. Normal behavior. Normal judgment and thought content.   Assessment and Plan:  Pregnancy: G3P2002 at 9765w4d  1. Supervision of other normal pregnancy, antepartum Routine testing - Tdap vaccine greater than or equal to 7yo IM - Flu Vaccine QUAD 36+ mos IM  2. History of macrosomia in infant in prior pregnancy, currently pregnant Average growth on f/u [place]  3. History of C-section Repeat will be scheduled 39 weeks  Preterm labor symptoms and general obstetric  precautions including but not limited to vaginal bleeding, contractions, leaking of fluid and fetal movement were reviewed in detail with the patient. Please refer to After Visit Summary for other counseling recommendations.  Return in about 2 weeks (around 04/09/2018).  No future appointments.  Scheryl DarterJames , MD

## 2018-03-26 NOTE — Patient Instructions (Signed)

## 2018-03-27 LAB — RPR: RPR: NONREACTIVE

## 2018-03-27 LAB — CBC
Hematocrit: 29 % — ABNORMAL LOW (ref 34.0–46.6)
Hemoglobin: 9.6 g/dL — ABNORMAL LOW (ref 11.1–15.9)
MCH: 26.4 pg — AB (ref 26.6–33.0)
MCHC: 33.1 g/dL (ref 31.5–35.7)
MCV: 80 fL (ref 79–97)
PLATELETS: 407 10*3/uL (ref 150–450)
RBC: 3.64 x10E6/uL — ABNORMAL LOW (ref 3.77–5.28)
RDW: 14.4 % (ref 12.3–15.4)
WBC: 11.7 10*3/uL — ABNORMAL HIGH (ref 3.4–10.8)

## 2018-03-27 LAB — GLUCOSE TOLERANCE, 2 HOURS W/ 1HR
GLUCOSE, 1 HOUR: 170 mg/dL (ref 65–179)
GLUCOSE, 2 HOUR: 158 mg/dL — AB (ref 65–152)
GLUCOSE, FASTING: 79 mg/dL (ref 65–91)

## 2018-03-27 LAB — HIV ANTIBODY (ROUTINE TESTING W REFLEX): HIV SCREEN 4TH GENERATION: NONREACTIVE

## 2018-03-28 ENCOUNTER — Telehealth: Payer: Self-pay | Admitting: *Deleted

## 2018-03-28 DIAGNOSIS — O24419 Gestational diabetes mellitus in pregnancy, unspecified control: Secondary | ICD-10-CM

## 2018-03-28 DIAGNOSIS — O24410 Gestational diabetes mellitus in pregnancy, diet controlled: Secondary | ICD-10-CM

## 2018-03-28 MED ORDER — ACCU-CHEK GUIDE ME W/DEVICE KIT: 1.0000 | PACK | Freq: Once | 0 refills | Status: AC

## 2018-03-28 MED ORDER — ACCU-CHEK FASTCLIX LANCETS MISC: 1.0000 | Freq: Four times a day (QID) | 12 refills | Status: DC

## 2018-03-28 MED ORDER — GLUCOSE BLOOD VI STRP: ORAL_STRIP | 12 refills | Status: DC

## 2018-03-28 NOTE — Telephone Encounter (Signed)
-----   Message from [place], [place] sent at 03/27/2018  6:21 PM EST ----- Dx GDM. Needs DM education.

## 2018-03-28 NOTE — Telephone Encounter (Signed)
I called Brooke Gregory and notified her of GDM and needs to meet with DM educator- she states can't come tomorrow, appointments full Tuesday, agreed to 04/05/18 4pm.She states [place] sent her message to go ahead and start checking her sugars but states she doesn't have the meter she used before. I informed her I can sent in to her pharmacy and to bring with her to meeting with DM educator . She voices understanding.

## 2018-04-05 ENCOUNTER — Ambulatory Visit: Payer: Medicaid Other | Admitting: *Deleted

## 2018-04-05 ENCOUNTER — Other Ambulatory Visit: Payer: Self-pay

## 2018-04-05 ENCOUNTER — Encounter: Payer: Medicaid Other | Attending: Obstetrics and Gynecology | Admitting: *Deleted

## 2018-04-05 DIAGNOSIS — O24410 Gestational diabetes mellitus in pregnancy, diet controlled: Secondary | ICD-10-CM | POA: Diagnosis not present

## 2018-04-05 DIAGNOSIS — O24419 Gestational diabetes mellitus in pregnancy, unspecified control: Secondary | ICD-10-CM

## 2018-04-05 DIAGNOSIS — Z713 Dietary counseling and surveillance: Secondary | ICD-10-CM | POA: Diagnosis present

## 2018-04-05 MED ORDER — ACCU-CHEK GUIDE W/DEVICE KIT: 1.0000 | PACK | Freq: Once | 0 refills | Status: AC

## 2018-04-05 NOTE — Progress Notes (Signed)
  Patient was seen on 04/05/2018 for Gestational Diabetes self-management. EDD 06/07/2018. Patient states history of GDM with her last pregnancy 2 years ago. Diet history obtained. Patient eats good variety of all food groups with additional sweets regularly. Beverages include water, regular soda and sweet tea. 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 The following learning objectives were met by the patient :   States the definition of Gestational Diabetes  States why dietary management is important in controlling blood glucose  Describes the effects of carbohydrates on blood glucose levels  Demonstrates ability to create a balanced meal plan  Demonstrates carbohydrate counting   States when to check blood glucose levels  Demonstrates proper blood glucose monitoring techniques  States the effect of stress and exercise on blood glucose levels  States the importance of limiting caffeine and abstaining from alcohol and smoking  Plan:  Aim for 3 Carb Choices per meal (45 grams) +/- 1 either way  Aim for 1-2 Carbs per snack Begin reading food labels for Total Carbohydrate of foods If OK with your MD, consider  increasing your activity level by walking, Arm Chair Exercises or other activity daily as tolerated Begin checking BG before breakfast and 2 hours after first bite of breakfast, lunch and dinner as directed by MD  Bring Log Book/Sheet to every medical appointment or use Baby Scripts Baby Scripts:  Patient was introduced to [employer] and plans to use as record of BG electronically  Take medication if directed by MD  Blood glucose monitor Rx called into pharmacy: [place] with Fast Clix drums Patient instructed to test pre breakfast and 2 hours after each meal as directed by MD  Patient instructed to monitor glucose levels: FBS: 60 - 95 mg/dl 2 hour: <120 mg/dl  Patient received the following handouts:  Nutrition Diabetes  and Pregnancy  Carbohydrate Counting List  BG Log Sheet  Patient will be seen for follow-up as needed.

## 2018-04-09 ENCOUNTER — Encounter: Payer: Medicaid Other | Admitting: Advanced Practice Midwife

## 2018-04-10 ENCOUNTER — Ambulatory Visit (INDEPENDENT_AMBULATORY_CARE_PROVIDER_SITE_OTHER): Payer: Medicaid Other | Admitting: Student

## 2018-04-10 VITALS — BP 119/80 | HR 109 | Wt 220.1 lb

## 2018-04-10 DIAGNOSIS — O34219 Maternal care for unspecified type scar from previous cesarean delivery: Secondary | ICD-10-CM

## 2018-04-10 DIAGNOSIS — O24419 Gestational diabetes mellitus in pregnancy, unspecified control: Secondary | ICD-10-CM | POA: Insufficient documentation

## 2018-04-10 DIAGNOSIS — Z3A31 31 weeks gestation of pregnancy: Secondary | ICD-10-CM

## 2018-04-10 DIAGNOSIS — Z98891 History of uterine scar from previous surgery: Secondary | ICD-10-CM

## 2018-04-10 DIAGNOSIS — O24410 Gestational diabetes mellitus in pregnancy, diet controlled: Secondary | ICD-10-CM

## 2018-04-10 DIAGNOSIS — Z3480 Encounter for supervision of other normal pregnancy, unspecified trimester: Secondary | ICD-10-CM

## 2018-04-10 LAB — GLUCOSE, CAPILLARY: GLUCOSE-CAPILLARY: 148 mg/dL — AB (ref 70–99)

## 2018-04-10 MED ORDER — AMOXICILLIN 875 MG PO TABS: 875.0000 mg | ORAL_TABLET | Freq: Two times a day (BID) | ORAL | 0 refills | Status: DC

## 2018-04-10 NOTE — Patient Instructions (Addendum)
AREA PEDIATRIC/FAMILY PRACTICE PHYSICIANS  [facility] [address] [place], [place]  [address] Phone - [phone]   Fax - [phone]  [facility] [address] [place], [place] [address] Phone - [phone]   Fax - [phone]  [facility] AMOS [address] [place], [place]  [address] Phone - [phone]   Fax - [phone]  [facility] [address] [place], [place]  [address] Phone - [phone]   Fax - [phone]  [facility] [address] [place], [place]  [address] Phone - [phone]   Fax - [phone]  [facility] [address] [place], [place]  [address] Phone - [phone]   Fax - [phone]  [facility] [address] [place], [place]  [address] Phone - [phone]   Fax - [phone]  [facility] AT [facility] [address] [place], [address] [place], [place]  [address] Phone - [phone]   Fax - [phone]  [facility] AT [facility] [address] [place], [place]  [address] Phone - [phone]   Fax - [phone] [facility] [place] [address] [place], [place]  [address] Phone - [phone]   Fax - [phone]  [facility] [address] [place], [place]  [address] Phone - [phone]   Fax - [phone]  [facility] [address], [address] [place], [place]  [address] Phone - [phone]   Fax - [phone]  [facility] [place] [address] [place], [place]  [address] Phone - [phone]   Fax - [phone]  [facility] [address] [place], [place]  [address] Phone - [phone]  [facility] [address] [place], [place]  [address] Phone - [phone]   Fax - [phone]  [facility] [address] [place], [place]  [address] Phone - [phone]   Fax - [phone]  [facility] [address] [place], [place]  [address] Phone - [phone]   Fax - [phone]  [facility] [address] [place], [place]  [address] Phone - [phone]   Fax - [phone]   [facility] [address] [place], [place]  [address] Phone - [phone]   Fax - [phone]  [facility] [address] [place], [place]  [address] Phone - [phone]   Fax - [phone]  DAVID RUBIN [address] [place], [place]  [address] Phone - [phone]   Fax - [phone]  [facility] [address] [place], [place]  [address] Phone - [phone]   Fax - [phone]  [facility] [address] [place], [place]  [address] Phone - [phone]   Fax - [phone] Gerarda Fraction [address] [place], [place]  [address] Phone - [phone]   Fax - [phone]  [facility] [address] [place], [place]  [address] Phone - [phone]   Fax - [phone]  [facility] [place] [address] [place], [place]  [address] Phone - [phone]   Fax - [phone]  [facility] [place] Wyvonne Lenz MD [address] [place] [address] Phone [phone]  Fax [phone]  Diabetes Mellitus and Nutrition When you have diabetes (diabetes mellitus), it is very important to  have healthy eating habits because your blood sugar (glucose) levels are greatly affected by what you eat and drink. Eating healthy foods in the appropriate amounts, at about the same times every day, can help you:  Control your blood glucose.  Lower your risk of heart disease.  Improve your blood pressure.  Reach or maintain a healthy weight.  Every person with diabetes is different,  and each person has different needs for a meal plan. Your health care provider may recommend that you work with a diet and nutrition specialist (dietitian) to make a meal plan that is best for you. Your meal plan may vary depending on factors such as:  The calories you need.  The medicines you take.  Your weight.  Your blood glucose, blood pressure, and cholesterol levels.  Your activity level.  Other health conditions you have, such as heart or kidney disease.  How do carbohydrates affect me? Carbohydrates affect your blood glucose level more than any other type of food. Eating carbohydrates naturally increases the amount of glucose in your blood. Carbohydrate counting is a method for keeping track of how many carbohydrates you eat. Counting carbohydrates is important to keep your blood glucose at a healthy level, especially if you use insulin or take certain oral diabetes medicines. It is important to know how many carbohydrates you can safely have in each meal. This is different for every person. Your dietitian can help you calculate how many carbohydrates you should have at each meal and for snack. Foods that contain carbohydrates include:  Bread, cereal, rice, pasta, and crackers.  Potatoes and corn.  Peas, beans, and lentils.  Milk and yogurt.  Fruit and juice.  Desserts, such as cakes, cookies, ice cream, and candy.  How does alcohol affect me? Alcohol can cause a sudden decrease in blood glucose (hypoglycemia), especially if you use insulin or take certain oral diabetes medicines. Hypoglycemia can be a life-threatening condition. Symptoms of hypoglycemia (sleepiness, dizziness, and confusion) are similar to symptoms of having too much alcohol. If your health care provider says that alcohol is safe for you, follow these guidelines:  Limit alcohol intake to no more than 1 drink per day for nonpregnant women and 2 drinks per day for men. One drink equals 12 oz of beer, 5 oz of  wine, or 1 oz of hard liquor.  Do not drink on an empty stomach.  Keep yourself hydrated with water, diet soda, or unsweetened iced tea.  Keep in mind that regular soda, juice, and other mixers may contain a lot of sugar and must be counted as carbohydrates.  What are tips for following this plan? Reading food labels  Start by checking the serving size on the label. The amount of calories, carbohydrates, fats, and other nutrients listed on the label are based on one serving of the food. Many foods contain more than one serving per package.  Check the total grams (g) of carbohydrates in one serving. You can calculate the number of servings of carbohydrates in one serving by dividing the total carbohydrates by 15. For example, if a food has 30 g of total carbohydrates, it would be equal to 2 servings of carbohydrates.  Check the number of grams (g) of saturated and trans fats in one serving. Choose foods that have low or no amount of these fats.  Check the number of milligrams (mg) of sodium in one serving. Most people should limit total sodium intake to less than 2,300 mg per day.  Always  check the nutrition information of foods labeled as "low-fat" or "nonfat". These foods may be higher in added sugar or refined carbohydrates and should be avoided.  Talk to your dietitian to identify your daily goals for nutrients listed on the label. Shopping  Avoid buying canned, premade, or processed foods. These foods tend to be high in fat, sodium, and added sugar.  Shop around the outside edge of the grocery store. This includes fresh fruits and vegetables, bulk grains, fresh meats, and fresh dairy. Cooking  Use low-heat cooking methods, such as baking, instead of high-heat cooking methods like deep frying.  Cook using healthy oils, such as olive, canola, or sunflower oil.  Avoid cooking with butter, cream, or high-fat meats. Meal planning  Eat meals and snacks regularly, preferably at the  same times every day. Avoid going long periods of time without eating.  Eat foods high in fiber, such as fresh fruits, vegetables, beans, and whole grains. Talk to your dietitian about how many servings of carbohydrates you can eat at each meal.  Eat 4-6 ounces of lean protein each day, such as lean meat, chicken, fish, eggs, or tofu. 1 ounce is equal to 1 ounce of meat, chicken, or fish, 1 egg, or 1/4 cup of tofu.  Eat some foods each day that contain healthy fats, such as avocado, nuts, seeds, and fish. Lifestyle   Check your blood glucose regularly.  Exercise at least 30 minutes 5 or more days each week, or as told by your health care provider.  Take medicines as told by your health care provider.  Do not use any products that contain nicotine or tobacco, such as cigarettes and e-cigarettes. If you need help quitting, ask your health care provider.  Work with a Veterinary surgeon or diabetes educator to identify strategies to manage stress and any emotional and social challenges. What are some questions to ask my health care provider?  Do I need to meet with a diabetes educator?  Do I need to meet with a dietitian?  What number can I call if I have questions?  When are the best times to check my blood glucose? Where to find more information:  American Diabetes Association: diabetes.org/food-and-fitness/food  Academy of Nutrition and Dietetics: https://www.vargas.com/  [employer] of Diabetes and Digestive and Kidney Diseases (NIH): FindJewelers.cz Summary  A healthy meal plan will help you control your blood glucose and maintain a healthy lifestyle.  Working with a diet and nutrition specialist (dietitian) can help you make a meal plan that is best for you.  Keep in mind that carbohydrates and alcohol have immediate effects on your blood glucose levels. It is  important to count carbohydrates and to use alcohol carefully. This information is not intended to replace advice given to you by your health care provider. Make sure you discuss any questions you have with your health care provider. Document Released: 01/06/2005 Document Revised: 05/16/2016 Document Reviewed: 05/16/2016 Elsevier Interactive Patient Education  [employer].

## 2018-04-10 NOTE — Progress Notes (Signed)
   PRENATAL VISIT NOTE  Subjective:  Brooke Gregory is a 27 y.o. G3P2002 at 3486w5d being seen today for ongoing prenatal care.  She is currently monitored for the following issues for this high-risk pregnancy and has Supervision of other normal pregnancy, antepartum; History of C-section; UTI (urinary tract infection); History of macrosomia in infant in prior pregnancy, currently pregnant; GBS bacteriuria; GBS (group B streptococcus) infection; and Gestational diabetes on their problem list.  Patient reports no complaints.  She took her macrobid but did not take her amoxicillan bc she was confused about taking it.   Contractions: Irregular. Vag. Bleeding: None.  Movement: Present. Denies leaking of fluid.   The following portions of the patient's history were reviewed and updated as appropriate: allergies, current medications, past family history, past medical history, past social history, past surgical history and problem list. Problem list updated.  Objective:   Vitals:   04/10/18 1631  BP: 119/80  Pulse: (!) 109  Weight: 220 lb 1.6 oz (99.8 kg)    Fetal Status: Fetal Heart Rate (bpm): 154 Fundal Height: 32 cm Movement: Present     General:  Alert, oriented and cooperative. Patient is in no acute distress.  Skin: Skin is warm and dry. No rash noted.   Cardiovascular: Normal heart rate noted  Respiratory: Normal respiratory effort, no problems with respiration noted  Abdomen: Soft, gravid, appropriate for gestational age.  Pain/Pressure: Present     Pelvic: Cervical exam deferred        Extremities: Normal range of motion.  Edema: Trace  Mental Status: Normal mood and affect. Normal behavior. Normal judgment and thought content.   Assessment and Plan:  Pregnancy: G3P2002 at 3386w5d  1. Supervision of other normal pregnancy, antepartum -patient will pick up RN for amoxicillan, she did not take her medicine in early November bc she was confused, she will pick up now and take. Will do  TOC at end of Jan.   2. Diet controlled gestational diabetes mellitus (GDM) in third trimester -has not picked up lancets because her pregnancy medicaid has been denied. Medicaid claims that she has "other insurance" but she does not. She will call case worker tomorrow and find out. She understand that she needs to start checking her BS immediately.  -Patient ate hot dog and milk shake today; FB was 143.   3. History of C-section -Patient is scheduled for C-section on 05-31-2017; also wants tubal. She signed papers today.   Preterm labor symptoms and general obstetric precautions including but not limited to vaginal bleeding, contractions, leaking of fluid and fetal movement were reviewed in detail with the patient. Please refer to After Visit Summary for other counseling recommendations.  Return in about 2 weeks (around 04/24/2018), or HROB with MD .  No future appointments.  Marylene LandKathryn Lorraine , CNM

## 2018-04-11 ENCOUNTER — Telehealth: Payer: Self-pay

## 2018-04-11 ENCOUNTER — Encounter (HOSPITAL_COMMUNITY): Payer: Self-pay

## 2018-04-11 NOTE — Telephone Encounter (Signed)
Called pt to advise of next USscheduled on 04/13/18 @ 12:45, pt verbalized understanding.

## 2018-04-13 ENCOUNTER — Other Ambulatory Visit: Payer: Self-pay

## 2018-04-13 ENCOUNTER — Ambulatory Visit (HOSPITAL_COMMUNITY)
Admission: RE | Admit: 2018-04-13 | Discharge: 2018-04-13 | Disposition: A | Discharge status: Home / Self Care | Payer: Medicaid Other | Source: Ambulatory Visit | Attending: Student | Admitting: Student

## 2018-04-13 ENCOUNTER — Other Ambulatory Visit (HOSPITAL_COMMUNITY): Payer: Self-pay | Admitting: *Deleted

## 2018-04-13 DIAGNOSIS — O99212 Obesity complicating pregnancy, second trimester: Secondary | ICD-10-CM | POA: Insufficient documentation

## 2018-04-13 DIAGNOSIS — O09293 Supervision of pregnancy with other poor reproductive or obstetric history, third trimester: Secondary | ICD-10-CM

## 2018-04-13 DIAGNOSIS — Z3A32 32 weeks gestation of pregnancy: Secondary | ICD-10-CM | POA: Diagnosis not present

## 2018-04-13 DIAGNOSIS — Z362 Encounter for other antenatal screening follow-up: Secondary | ICD-10-CM

## 2018-04-13 DIAGNOSIS — O24419 Gestational diabetes mellitus in pregnancy, unspecified control: Secondary | ICD-10-CM

## 2018-04-13 DIAGNOSIS — O24410 Gestational diabetes mellitus in pregnancy, diet controlled: Secondary | ICD-10-CM | POA: Diagnosis not present

## 2018-04-13 DIAGNOSIS — O34219 Maternal care for unspecified type scar from previous cesarean delivery: Secondary | ICD-10-CM | POA: Diagnosis not present

## 2018-04-13 DIAGNOSIS — O99213 Obesity complicating pregnancy, third trimester: Secondary | ICD-10-CM | POA: Diagnosis not present

## 2018-04-13 MED ORDER — ACCU-CHEK FASTCLIX LANCETS MISC: 1.0000 | Freq: Four times a day (QID) | 12 refills | Status: DC

## 2018-04-13 MED ORDER — GLUCOSE BLOOD VI STRP: ORAL_STRIP | 12 refills | Status: DC

## 2018-04-16 ENCOUNTER — Other Ambulatory Visit: Payer: Self-pay | Admitting: Obstetrics & Gynecology

## 2018-04-16 NOTE — Progress Notes (Signed)
Orders for surgery 

## 2018-04-26 ENCOUNTER — Ambulatory Visit (INDEPENDENT_AMBULATORY_CARE_PROVIDER_SITE_OTHER): Payer: Medicaid Other | Admitting: Obstetrics and Gynecology

## 2018-04-26 ENCOUNTER — Encounter: Payer: Self-pay | Admitting: *Deleted

## 2018-04-26 ENCOUNTER — Encounter: Payer: Self-pay | Admitting: Obstetrics and Gynecology

## 2018-04-26 VITALS — BP 119/79 | HR 109 | Wt 224.2 lb

## 2018-04-26 DIAGNOSIS — Z3483 Encounter for supervision of other normal pregnancy, third trimester: Secondary | ICD-10-CM

## 2018-04-26 DIAGNOSIS — Z3480 Encounter for supervision of other normal pregnancy, unspecified trimester: Secondary | ICD-10-CM

## 2018-04-26 DIAGNOSIS — Z98891 History of uterine scar from previous surgery: Secondary | ICD-10-CM

## 2018-04-26 DIAGNOSIS — R8271 Bacteriuria: Secondary | ICD-10-CM

## 2018-04-26 DIAGNOSIS — O24410 Gestational diabetes mellitus in pregnancy, diet controlled: Secondary | ICD-10-CM

## 2018-04-26 NOTE — Progress Notes (Signed)
Has not logged in [place] scripts , left paper logs at home.

## 2018-04-26 NOTE — Progress Notes (Signed)
Subjective:  Brooke Gregory is a 28 y.o. G3P2002 at [redacted]w[redacted]d being seen today for ongoing prenatal care.  She is currently monitored for the following issues for this high-risk pregnancy and has Supervision of other normal pregnancy, antepartum; History of C-section; History of macrosomia in infant in prior pregnancy, currently pregnant; GBS bacteriuria; GBS (group B streptococcus) infection; and Gestational diabetes on their problem list.  Patient reports no complaints.  Contractions: Irritability. Vag. Bleeding: None.  Movement: (!) Decreased. Denies leaking of fluid.   The following portions of the patient's history were reviewed and updated as appropriate: allergies, current medications, past family history, past medical history, past social history, past surgical history and problem list. Problem list updated.  Objective:   Vitals:   04/26/18 0958  BP: 119/79  Pulse: (!) 109  Weight: 224 lb 3.2 oz (101.7 kg)    Fetal Status: Fetal Heart Rate (bpm): 153   Movement: (!) Decreased     General:  Alert, oriented and cooperative. Patient is in no acute distress.  Skin: Skin is warm and dry. No rash noted.   Cardiovascular: Normal heart rate noted  Respiratory: Normal respiratory effort, no problems with respiration noted  Abdomen: Soft, gravid, appropriate for gestational age. Pain/Pressure: Present     Pelvic:  Cervical exam deferred        Extremities: Normal range of motion.  Edema: Trace  Mental Status: Normal mood and affect. Normal behavior. Normal judgment and thought content.   Urinalysis:      Assessment and Plan:  Pregnancy: G3P2002 at [redacted]w[redacted]d  1. Supervision of other normal pregnancy, antepartum Stable  2. Diet controlled gestational diabetes mellitus (GDM) in third trimester Did not bring CBG's but reports in goal range Continue with diet Growth 85% on 04/13/18 F/U growth on 05/10/18  3. GBS bacteriuria Tx at time of c section  4. History of C-section Repeat  scheduled for 05/31/18 with BTL Papers signed today  Preterm labor symptoms and general obstetric precautions including but not limited to vaginal bleeding, contractions, leaking of fluid and fetal movement were reviewed in detail with the patient. Please refer to After Visit Summary for other counseling recommendations.  Return in about 2 weeks (around 05/10/2018) for OB visit.   Hermina Staggers, MD

## 2018-04-26 NOTE — Patient Instructions (Signed)
Third Trimester of Pregnancy The third trimester is from week 28 through week 40 (months 7 through 9). The third trimester is a time when the unborn baby (fetus) is growing rapidly. At the end of the ninth month, the fetus is about 20 inches in length and weighs 6-10 pounds. Body changes during your third trimester Your body will continue to go through many changes during pregnancy. The changes vary from woman to woman. During the third trimester:  Your weight will continue to increase. You can expect to gain 25-35 pounds (11-16 kg) by the end of the pregnancy.  You may begin to get stretch marks on your hips, abdomen, and breasts.  You may urinate more often because the fetus is moving lower into your pelvis and pressing on your bladder.  You may develop or continue to have heartburn. This is caused by increased hormones that slow down muscles in the digestive tract.  You may develop or continue to have constipation because increased hormones slow digestion and cause the muscles that push waste through your intestines to relax.  You may develop hemorrhoids. These are swollen veins (varicose veins) in the rectum that can itch or be painful.  You may develop swollen, bulging veins (varicose veins) in your legs.  You may have increased body aches in the pelvis, back, or thighs. This is due to weight gain and increased hormones that are relaxing your joints.  You may have changes in your hair. These can include thickening of your hair, rapid growth, and changes in texture. Some women also have hair loss during or after pregnancy, or hair that feels dry or thin. Your hair will most likely return to normal after your baby is born.  Your breasts will continue to grow and they will continue to become tender. A yellow fluid (colostrum) may leak from your breasts. This is the first milk you are producing for your baby.  Your belly button may stick out.  You may notice more swelling in your hands,  face, or ankles.  You may have increased tingling or numbness in your hands, arms, and legs. The skin on your belly may also feel numb.  You may feel short of breath because of your expanding uterus.  You may have more problems sleeping. This can be caused by the size of your belly, increased need to urinate, and an increase in your body's metabolism.  You may notice the fetus "dropping," or moving lower in your abdomen (lightening).  You may have increased vaginal discharge.  You may notice your joints feel loose and you may have pain around your pelvic bone. What to expect at prenatal visits You will have prenatal exams every 2 weeks until week 36. Then you will have weekly prenatal exams. During a routine prenatal visit:  You will be weighed to make sure you and the baby are growing normally.  Your blood pressure will be taken.  Your abdomen will be measured to track your baby's growth.  The fetal heartbeat will be listened to.  Any test results from the previous visit will be discussed.  You may have a cervical check near your due date to see if your cervix has softened or thinned (effaced).  You will be tested for Group B streptococcus. This happens between 35 and 37 weeks. Your health care provider may ask you:  What your birth plan is.  How you are feeling.  If you are feeling the baby move.  If you have had any abnormal   symptoms, such as leaking fluid, bleeding, severe headaches, or abdominal cramping.  If you are using any tobacco products, including cigarettes, chewing tobacco, and electronic cigarettes.  If you have any questions. Other tests or screenings that may be performed during your third trimester include:  Blood tests that check for low iron levels (anemia).  Fetal testing to check the health, activity level, and growth of the fetus. Testing is done if you have certain medical conditions or if there are problems during the pregnancy.  Nonstress test  (NST). This test checks the health of your baby to make sure there are no signs of problems, such as the baby not getting enough oxygen. During this test, a belt is placed around your belly. The baby is made to move, and its heart rate is monitored during movement. What is false labor? False labor is a condition in which you feel small, irregular tightenings of the muscles in the womb (contractions) that usually go away with rest, changing position, or drinking water. These are called Braxton Hicks contractions. Contractions may last for hours, days, or even weeks before true labor sets in. If contractions come at regular intervals, become more frequent, increase in intensity, or become painful, you should see your health care provider. What are the signs of labor?  Abdominal cramps.  Regular contractions that start at 10 minutes apart and become stronger and more frequent with time.  Contractions that start on the top of the uterus and spread down to the lower abdomen and back.  Increased pelvic pressure and dull back pain.  A watery or bloody mucus discharge that comes from the vagina.  Leaking of amniotic fluid. This is also known as your "water breaking." It could be a slow trickle or a gush. Let your health care provider know if it has a color or strange odor. If you have any of these signs, call your health care provider right away, even if it is before your due date. Follow these instructions at home: Medicines  Follow your health care provider's instructions regarding medicine use. Specific medicines may be either safe or unsafe to take during pregnancy.  Take a prenatal vitamin that contains at least 600 micrograms (mcg) of folic acid.  If you develop constipation, try taking a stool softener if your health care provider approves. Eating and drinking   Eat a balanced diet that includes fresh fruits and vegetables, whole grains, good sources of protein such as meat, eggs, or tofu,  and low-fat dairy. Your health care provider will help you determine the amount of weight gain that is right for you.  Avoid raw meat and uncooked cheese. These carry germs that can cause birth defects in the baby.  If you have low calcium intake from food, talk to your health care provider about whether you should take a daily calcium supplement.  Eat four or five small meals rather than three large meals a day.  Limit foods that are high in fat and processed sugars, such as fried and sweet foods.  To prevent constipation: ? Drink enough fluid to keep your urine clear or pale yellow. ? Eat foods that are high in fiber, such as fresh fruits and vegetables, whole grains, and beans. Activity  Exercise only as directed by your health care provider. Most women can continue their usual exercise routine during pregnancy. Try to exercise for 30 minutes at least 5 days a week. Stop exercising if you experience uterine contractions.  Avoid heavy lifting.  Do   not exercise in extreme heat or humidity, or at high altitudes.  Wear low-heel, comfortable shoes.  Practice good posture.  You may continue to have sex unless your health care provider tells you otherwise. Relieving pain and discomfort  Take frequent breaks and rest with your legs elevated if you have leg cramps or low back pain.  Take warm sitz baths to soothe any pain or discomfort caused by hemorrhoids. Use hemorrhoid cream if your health care provider approves.  Wear a good support bra to prevent discomfort from breast tenderness.  If you develop varicose veins: ? Wear support pantyhose or compression stockings as told by your healthcare provider. ? Elevate your feet for 15 minutes, 3-4 times a day. Prenatal care  Write down your questions. Take them to your prenatal visits.  Keep all your prenatal visits as told by your health care provider. This is important. Safety  Wear your seat belt at all times when driving.  Make  a list of emergency phone numbers, including numbers for family, friends, the hospital, and police and fire departments. General instructions  Avoid cat litter boxes and soil used by cats. These carry germs that can cause birth defects in the baby. If you have a cat, ask someone to clean the litter box for you.  Do not travel far distances unless it is absolutely necessary and only with the approval of your health care provider.  Do not use hot tubs, steam rooms, or saunas.  Do not drink alcohol.  Do not use any products that contain nicotine or tobacco, such as cigarettes and e-cigarettes. If you need help quitting, ask your health care provider.  Do not use any medicinal herbs or unprescribed drugs. These chemicals affect the formation and growth of the baby.  Do not douche or use tampons or scented sanitary pads.  Do not cross your legs for long periods of time.  To prepare for the arrival of your baby: ? Take prenatal classes to understand, practice, and ask questions about labor and delivery. ? Make a trial run to the hospital. ? Visit the hospital and tour the maternity area. ? Arrange for maternity or paternity leave through employers. ? Arrange for family and friends to take care of pets while you are in the hospital. ? Purchase a rear-facing car seat and make sure you know how to install it in your car. ? Pack your hospital bag. ? Prepare the baby's nursery. Make sure to remove all pillows and stuffed animals from the baby's crib to prevent suffocation.  Visit your dentist if you have not gone during your pregnancy. Use a soft toothbrush to brush your teeth and be gentle when you floss. Contact a health care provider if:  You are unsure if you are in labor or if your water has broken.  You become dizzy.  You have mild pelvic cramps, pelvic pressure, or nagging pain in your abdominal area.  You have lower back pain.  You have persistent nausea, vomiting, or  diarrhea.  You have an unusual or bad smelling vaginal discharge.  You have pain when you urinate. Get help right away if:  Your water breaks before 37 weeks.  You have regular contractions less than 5 minutes apart before 37 weeks.  You have a fever.  You are leaking fluid from your vagina.  You have spotting or bleeding from your vagina.  You have severe abdominal pain or cramping.  You have rapid weight loss or weight gain.  You have   shortness of breath with chest pain.  You notice sudden or extreme swelling of your face, hands, ankles, feet, or legs.  Your baby makes fewer than 10 movements in 2 hours.  You have severe headaches that do not go away when you take medicine.  You have vision changes. Summary  The third trimester is from week 28 through week 40, months 7 through 9. The third trimester is a time when the unborn baby (fetus) is growing rapidly.  During the third trimester, your discomfort may increase as you and your baby continue to gain weight. You may have abdominal, leg, and back pain, sleeping problems, and an increased need to urinate.  During the third trimester your breasts will keep growing and they will continue to become tender. A yellow fluid (colostrum) may leak from your breasts. This is the first milk you are producing for your baby.  False labor is a condition in which you feel small, irregular tightenings of the muscles in the womb (contractions) that eventually go away. These are called Braxton Hicks contractions. Contractions may last for hours, days, or even weeks before true labor sets in.  Signs of labor can include: abdominal cramps; regular contractions that start at 10 minutes apart and become stronger and more frequent with time; watery or bloody mucus discharge that comes from the vagina; increased pelvic pressure and dull back pain; and leaking of amniotic fluid. This information is not intended to replace advice given to you by your  health care provider. Make sure you discuss any questions you have with your health care provider. Document Released: 04/05/2001 Document Revised: 05/17/2016 Document Reviewed: 05/17/2016 Elsevier Interactive Patient Education  2019 [employer]  

## 2018-05-03 ENCOUNTER — Encounter: Payer: Self-pay | Admitting: General Practice

## 2018-05-03 NOTE — Progress Notes (Signed)
Erroneous encounter

## 2018-05-10 ENCOUNTER — Ambulatory Visit (INDEPENDENT_AMBULATORY_CARE_PROVIDER_SITE_OTHER): Payer: Medicaid Other | Admitting: Obstetrics and Gynecology

## 2018-05-10 ENCOUNTER — Encounter (HOSPITAL_COMMUNITY): Payer: Self-pay

## 2018-05-10 ENCOUNTER — Other Ambulatory Visit (HOSPITAL_COMMUNITY)
Admission: RE | Admit: 2018-05-10 | Discharge: 2018-05-10 | Disposition: A | Discharge status: Home / Self Care | Payer: Medicaid Other | Source: Ambulatory Visit | Attending: Obstetrics and Gynecology | Admitting: Obstetrics and Gynecology

## 2018-05-10 ENCOUNTER — Other Ambulatory Visit (HOSPITAL_COMMUNITY): Payer: Self-pay | Admitting: *Deleted

## 2018-05-10 ENCOUNTER — Encounter: Payer: Self-pay | Admitting: Obstetrics and Gynecology

## 2018-05-10 ENCOUNTER — Ambulatory Visit (HOSPITAL_COMMUNITY)
Admission: RE | Admit: 2018-05-10 | Discharge: 2018-05-10 | Disposition: A | Discharge status: Home / Self Care | Payer: Medicaid Other | Source: Ambulatory Visit | Attending: Student | Admitting: Student

## 2018-05-10 VITALS — BP 111/72 | HR 97 | Wt 224.7 lb

## 2018-05-10 DIAGNOSIS — Z3483 Encounter for supervision of other normal pregnancy, third trimester: Secondary | ICD-10-CM

## 2018-05-10 DIAGNOSIS — O99212 Obesity complicating pregnancy, second trimester: Secondary | ICD-10-CM | POA: Diagnosis not present

## 2018-05-10 DIAGNOSIS — O24419 Gestational diabetes mellitus in pregnancy, unspecified control: Secondary | ICD-10-CM

## 2018-05-10 DIAGNOSIS — R8271 Bacteriuria: Secondary | ICD-10-CM

## 2018-05-10 DIAGNOSIS — O24410 Gestational diabetes mellitus in pregnancy, diet controlled: Secondary | ICD-10-CM | POA: Diagnosis not present

## 2018-05-10 DIAGNOSIS — Z3480 Encounter for supervision of other normal pregnancy, unspecified trimester: Secondary | ICD-10-CM | POA: Insufficient documentation

## 2018-05-10 DIAGNOSIS — Z3A36 36 weeks gestation of pregnancy: Secondary | ICD-10-CM

## 2018-05-10 DIAGNOSIS — O34219 Maternal care for unspecified type scar from previous cesarean delivery: Secondary | ICD-10-CM

## 2018-05-10 DIAGNOSIS — O3663X0 Maternal care for excessive fetal growth, third trimester, not applicable or unspecified: Secondary | ICD-10-CM

## 2018-05-10 DIAGNOSIS — O09293 Supervision of pregnancy with other poor reproductive or obstetric history, third trimester: Secondary | ICD-10-CM

## 2018-05-10 DIAGNOSIS — Z362 Encounter for other antenatal screening follow-up: Secondary | ICD-10-CM

## 2018-05-10 DIAGNOSIS — Z98891 History of uterine scar from previous surgery: Secondary | ICD-10-CM

## 2018-05-10 DIAGNOSIS — Z3A35 35 weeks gestation of pregnancy: Secondary | ICD-10-CM

## 2018-05-10 NOTE — Progress Notes (Signed)
Subjective:  Brooke Gregory is a 28 y.o. G3P2002 at [redacted]w[redacted]d being seen today for ongoing prenatal care.  She is currently monitored for the following issues for this high-risk pregnancy and has Supervision of other normal pregnancy, antepartum; History of C-section; History of macrosomia in infant in prior pregnancy, currently pregnant; GBS bacteriuria; GBS (group B streptococcus) infection; and Gestational diabetes on their problem list.  Patient reports general discomforts of pregnancy.  Contractions: Irritability. Vag. Bleeding: None.  Movement: Present. Denies leaking of fluid.   The following portions of the patient's history were reviewed and updated as appropriate: allergies, current medications, past family history, past medical history, past social history, past surgical history and problem list. Problem list updated.  Objective:   Vitals:   05/10/18 1436  BP: 111/72  Pulse: 97  Weight: 224 lb 11.2 oz (101.9 kg)    Fetal Status: Fetal Heart Rate (bpm): 143   Movement: Present     General:  Alert, oriented and cooperative. Patient is in no acute distress.  Skin: Skin is warm and dry. No rash noted.   Cardiovascular: Normal heart rate noted  Respiratory: Normal respiratory effort, no problems with respiration noted  Abdomen: Soft, gravid, appropriate for gestational age. Pain/Pressure: Present     Pelvic:  Cervical exam performed        Extremities: Normal range of motion.  Edema: Trace  Mental Status: Normal mood and affect. Normal behavior. Normal judgment and thought content.   Urinalysis:      Assessment and Plan:  Pregnancy: G3P2002 at [redacted]w[redacted]d  1. Supervision of other normal pregnancy, antepartum Stable - GC/Chlamydia probe amp ([facility])not at [facility]  2. Diet controlled gestational diabetes mellitus (GDM) in third trimester CBG's in goal range Continue with diet Growth scan today  3. GBS bacteriuria Tx with c section  4. History of C-section Scheduled 05/31/18  with BTL  Term labor symptoms and general obstetric precautions including but not limited to vaginal bleeding, contractions, leaking of fluid and fetal movement were reviewed in detail with the patient. Please refer to After Visit Summary for other counseling recommendations.  Return in about 1 week (around 05/17/2018) for OB visit.   Hermina Staggers, MD

## 2018-05-10 NOTE — Patient Instructions (Signed)
Third Trimester of Pregnancy The third trimester is from week 28 through week 40 (months 7 through 9). The third trimester is a time when the unborn baby (fetus) is growing rapidly. At the end of the ninth month, the fetus is about 20 inches in length and weighs 6-10 pounds. Body changes during your third trimester Your body will continue to go through many changes during pregnancy. The changes vary from woman to woman. During the third trimester:  Your weight will continue to increase. You can expect to gain 25-35 pounds (11-16 kg) by the end of the pregnancy.  You may begin to get stretch marks on your hips, abdomen, and breasts.  You may urinate more often because the fetus is moving lower into your pelvis and pressing on your bladder.  You may develop or continue to have heartburn. This is caused by increased hormones that slow down muscles in the digestive tract.  You may develop or continue to have constipation because increased hormones slow digestion and cause the muscles that push waste through your intestines to relax.  You may develop hemorrhoids. These are swollen veins (varicose veins) in the rectum that can itch or be painful.  You may develop swollen, bulging veins (varicose veins) in your legs.  You may have increased body aches in the pelvis, back, or thighs. This is due to weight gain and increased hormones that are relaxing your joints.  You may have changes in your hair. These can include thickening of your hair, rapid growth, and changes in texture. Some women also have hair loss during or after pregnancy, or hair that feels dry or thin. Your hair will most likely return to normal after your baby is born.  Your breasts will continue to grow and they will continue to become tender. A yellow fluid (colostrum) may leak from your breasts. This is the first milk you are producing for your baby.  Your belly button may stick out.  You may notice more swelling in your hands,  face, or ankles.  You may have increased tingling or numbness in your hands, arms, and legs. The skin on your belly may also feel numb.  You may feel short of breath because of your expanding uterus.  You may have more problems sleeping. This can be caused by the size of your belly, increased need to urinate, and an increase in your body's metabolism.  You may notice the fetus "dropping," or moving lower in your abdomen (lightening).  You may have increased vaginal discharge.  You may notice your joints feel loose and you may have pain around your pelvic bone. What to expect at prenatal visits You will have prenatal exams every 2 weeks until week 36. Then you will have weekly prenatal exams. During a routine prenatal visit:  You will be weighed to make sure you and the baby are growing normally.  Your blood pressure will be taken.  Your abdomen will be measured to track your baby's growth.  The fetal heartbeat will be listened to.  Any test results from the previous visit will be discussed.  You may have a cervical check near your due date to see if your cervix has softened or thinned (effaced).  You will be tested for Group B streptococcus. This happens between 35 and 37 weeks. Your health care provider may ask you:  What your birth plan is.  How you are feeling.  If you are feeling the baby move.  If you have had any abnormal   symptoms, such as leaking fluid, bleeding, severe headaches, or abdominal cramping.  If you are using any tobacco products, including cigarettes, chewing tobacco, and electronic cigarettes.  If you have any questions. Other tests or screenings that may be performed during your third trimester include:  Blood tests that check for low iron levels (anemia).  Fetal testing to check the health, activity level, and growth of the fetus. Testing is done if you have certain medical conditions or if there are problems during the pregnancy.  Nonstress test  (NST). This test checks the health of your baby to make sure there are no signs of problems, such as the baby not getting enough oxygen. During this test, a belt is placed around your belly. The baby is made to move, and its heart rate is monitored during movement. What is false labor? False labor is a condition in which you feel small, irregular tightenings of the muscles in the womb (contractions) that usually go away with rest, changing position, or drinking water. These are called Braxton Hicks contractions. Contractions may last for hours, days, or even weeks before true labor sets in. If contractions come at regular intervals, become more frequent, increase in intensity, or become painful, you should see your health care provider. What are the signs of labor?  Abdominal cramps.  Regular contractions that start at 10 minutes apart and become stronger and more frequent with time.  Contractions that start on the top of the uterus and spread down to the lower abdomen and back.  Increased pelvic pressure and dull back pain.  A watery or bloody mucus discharge that comes from the vagina.  Leaking of amniotic fluid. This is also known as your "water breaking." It could be a slow trickle or a gush. Let your health care provider know if it has a color or strange odor. If you have any of these signs, call your health care provider right away, even if it is before your due date. Follow these instructions at home: Medicines  Follow your health care provider's instructions regarding medicine use. Specific medicines may be either safe or unsafe to take during pregnancy.  Take a prenatal vitamin that contains at least 600 micrograms (mcg) of folic acid.  If you develop constipation, try taking a stool softener if your health care provider approves. Eating and drinking   Eat a balanced diet that includes fresh fruits and vegetables, whole grains, good sources of protein such as meat, eggs, or tofu,  and low-fat dairy. Your health care provider will help you determine the amount of weight gain that is right for you.  Avoid raw meat and uncooked cheese. These carry germs that can cause birth defects in the baby.  If you have low calcium intake from food, talk to your health care provider about whether you should take a daily calcium supplement.  Eat four or five small meals rather than three large meals a day.  Limit foods that are high in fat and processed sugars, such as fried and sweet foods.  To prevent constipation: ? Drink enough fluid to keep your urine clear or pale yellow. ? Eat foods that are high in fiber, such as fresh fruits and vegetables, whole grains, and beans. Activity  Exercise only as directed by your health care provider. Most women can continue their usual exercise routine during pregnancy. Try to exercise for 30 minutes at least 5 days a week. Stop exercising if you experience uterine contractions.  Avoid heavy lifting.  Do   not exercise in extreme heat or humidity, or at high altitudes.  Wear low-heel, comfortable shoes.  Practice good posture.  You may continue to have sex unless your health care provider tells you otherwise. Relieving pain and discomfort  Take frequent breaks and rest with your legs elevated if you have leg cramps or low back pain.  Take warm sitz baths to soothe any pain or discomfort caused by hemorrhoids. Use hemorrhoid cream if your health care provider approves.  Wear a good support bra to prevent discomfort from breast tenderness.  If you develop varicose veins: ? Wear support pantyhose or compression stockings as told by your healthcare provider. ? Elevate your feet for 15 minutes, 3-4 times a day. Prenatal care  Write down your questions. Take them to your prenatal visits.  Keep all your prenatal visits as told by your health care provider. This is important. Safety  Wear your seat belt at all times when driving.  Make  a list of emergency phone numbers, including numbers for family, friends, the hospital, and police and fire departments. General instructions  Avoid cat litter boxes and soil used by cats. These carry germs that can cause birth defects in the baby. If you have a cat, ask someone to clean the litter box for you.  Do not travel far distances unless it is absolutely necessary and only with the approval of your health care provider.  Do not use hot tubs, steam rooms, or saunas.  Do not drink alcohol.  Do not use any products that contain nicotine or tobacco, such as cigarettes and e-cigarettes. If you need help quitting, ask your health care provider.  Do not use any medicinal herbs or unprescribed drugs. These chemicals affect the formation and growth of the baby.  Do not douche or use tampons or scented sanitary pads.  Do not cross your legs for long periods of time.  To prepare for the arrival of your baby: ? Take prenatal classes to understand, practice, and ask questions about labor and delivery. ? Make a trial run to the hospital. ? Visit the hospital and tour the maternity area. ? Arrange for maternity or paternity leave through employers. ? Arrange for family and friends to take care of pets while you are in the hospital. ? Purchase a rear-facing car seat and make sure you know how to install it in your car. ? Pack your hospital bag. ? Prepare the baby's nursery. Make sure to remove all pillows and stuffed animals from the baby's crib to prevent suffocation.  Visit your dentist if you have not gone during your pregnancy. Use a soft toothbrush to brush your teeth and be gentle when you floss. Contact a health care provider if:  You are unsure if you are in labor or if your water has broken.  You become dizzy.  You have mild pelvic cramps, pelvic pressure, or nagging pain in your abdominal area.  You have lower back pain.  You have persistent nausea, vomiting, or  diarrhea.  You have an unusual or bad smelling vaginal discharge.  You have pain when you urinate. Get help right away if:  Your water breaks before 37 weeks.  You have regular contractions less than 5 minutes apart before 37 weeks.  You have a fever.  You are leaking fluid from your vagina.  You have spotting or bleeding from your vagina.  You have severe abdominal pain or cramping.  You have rapid weight loss or weight gain.  You have   shortness of breath with chest pain.  You notice sudden or extreme swelling of your face, hands, ankles, feet, or legs.  Your baby makes fewer than 10 movements in 2 hours.  You have severe headaches that do not go away when you take medicine.  You have vision changes. Summary  The third trimester is from week 28 through week 40, months 7 through 9. The third trimester is a time when the unborn baby (fetus) is growing rapidly.  During the third trimester, your discomfort may increase as you and your baby continue to gain weight. You may have abdominal, leg, and back pain, sleeping problems, and an increased need to urinate.  During the third trimester your breasts will keep growing and they will continue to become tender. A yellow fluid (colostrum) may leak from your breasts. This is the first milk you are producing for your baby.  False labor is a condition in which you feel small, irregular tightenings of the muscles in the womb (contractions) that eventually go away. These are called Braxton Hicks contractions. Contractions may last for hours, days, or even weeks before true labor sets in.  Signs of labor can include: abdominal cramps; regular contractions that start at 10 minutes apart and become stronger and more frequent with time; watery or bloody mucus discharge that comes from the vagina; increased pelvic pressure and dull back pain; and leaking of amniotic fluid. This information is not intended to replace advice given to you by your  health care provider. Make sure you discuss any questions you have with your health care provider. Document Released: 04/05/2001 Document Revised: 05/17/2016 Document Reviewed: 05/17/2016 Elsevier Interactive Patient Education  2019 [employer]  

## 2018-05-10 NOTE — Progress Notes (Signed)
Pt has paper log. 

## 2018-05-11 LAB — GC/CHLAMYDIA PROBE AMP (~~LOC~~) NOT AT ARMC
Chlamydia: NEGATIVE
Neisseria Gonorrhea: NEGATIVE

## 2018-05-16 ENCOUNTER — Encounter (HOSPITAL_COMMUNITY): Payer: Self-pay

## 2018-05-22 ENCOUNTER — Ambulatory Visit (INDEPENDENT_AMBULATORY_CARE_PROVIDER_SITE_OTHER): Payer: Medicaid Other | Admitting: Student

## 2018-05-22 ENCOUNTER — Encounter: Payer: Self-pay | Admitting: Student

## 2018-05-22 VITALS — BP 127/78 | HR 102 | Wt 226.0 lb

## 2018-05-22 DIAGNOSIS — R8271 Bacteriuria: Secondary | ICD-10-CM

## 2018-05-22 DIAGNOSIS — Z3483 Encounter for supervision of other normal pregnancy, third trimester: Secondary | ICD-10-CM

## 2018-05-22 DIAGNOSIS — Z3480 Encounter for supervision of other normal pregnancy, unspecified trimester: Secondary | ICD-10-CM

## 2018-05-22 NOTE — Progress Notes (Signed)
   PRENATAL VISIT NOTE  Subjective:  Brooke Gregory is a 28 y.o. G3P2002 at [redacted]w[redacted]d being seen today for ongoing prenatal care.  She is currently monitored for the following issues for this high-risk pregnancy and has Supervision of other normal pregnancy, antepartum; History of C-section; History of macrosomia in infant in prior pregnancy, currently pregnant; GBS bacteriuria; GBS (group B streptococcus) infection; and Gestational diabetes on their problem list.  Patient reports no complaints.  Contractions: Irritability. Vag. Bleeding: None.  Movement: Present. Denies leaking of fluid.   The following portions of the patient's history were reviewed and updated as appropriate: allergies, current medications, past family history, past medical history, past social history, past surgical history and problem list. Problem list updated.  Objective:   Vitals:   05/22/18 0949  BP: 127/78  Pulse: (!) 102  Weight: 226 lb (102.5 kg)    Fetal Status: Fetal Heart Rate (bpm): 144 Fundal Height: 40 cm Movement: Present  Presentation: Vertex  General:  Alert, oriented and cooperative. Patient is in no acute distress.  Skin: Skin is warm and dry. No rash noted.   Cardiovascular: Normal heart rate noted  Respiratory: Normal respiratory effort, no problems with respiration noted  Abdomen: Soft, gravid, appropriate for gestational age.  Pain/Pressure: Present     Pelvic: Cervical exam performed Dilation: 1 Effacement (%): Thick Station: -3  Extremities: Normal range of motion.  Edema: Trace  Mental Status: Normal mood and affect. Normal behavior. Normal judgment and thought content.   Assessment and Plan:  Pregnancy: G3P2002 at [redacted]w[redacted]d  1. Supervision of other normal pregnancy, antepartum -pt did not bring blood sugar log with her today. Log was reviewed by Dr. Alysia Penna at her last visit with good results & to continue as diet controlled. Patient states her BS have been comparable to her last visit. Will  bring log to next visit  2. GBS bacteriuria -TOC urine cx for previous UTI - Culture, OB Urine  Term labor symptoms and general obstetric precautions including but not limited to vaginal bleeding, contractions, leaking of fluid and fetal movement were reviewed in detail with the patient. Please refer to After Visit Summary for other counseling recommendations.  Return in about 1 week (around 05/29/2018) for High Risk OB.  Future Appointments  Date Time Provider Department Center  05/29/2018  9:35 AM Tamera Stands, DO [facility]  05/30/2018 10:00 AM [facility] PAT 5 [facility] None  05/31/2018  1:30 PM [facility] [place] 1 [facility]    Judeth Horn, NP

## 2018-05-22 NOTE — Patient Instructions (Signed)
Before Baby Comes Home  Once your baby is home with you, things may become a bit hectic as you map out a schedule around your newborn's patterns. Preparing the things you need at home before that time comes is important. Before your baby arrives, make sure you:   Have all the supplies that you will need to care for your baby.   Know where to go if there is an emergency.   Discuss the baby's arrival with other family members.  What supplies will I need?  Having the following supplies ready before your baby arrives will help ensure that you are prepared:  Large items   Crib or bassinet and mattress. Make sure to follow safe sleep recommendations to reduce the risk of sudden infant death syndrome.   Rear-facing infant car seat. Have a trained professional check to make sure that it is installed in your car correctly. Many hospitals and fire departments perform this service free of charge.   Stroller.  Always make sure any products--including cribs, mattresses, bassinets, or portable cribs and play areas--are safe. Check for recalls on your specific brand and model of crib.  Breastfeeding   Nursing pillow.   Milk storage containers or bags.   Nipple cream.   Nursing bra.   Breast pads.   Breast pump.   Breast shields.  Feeding   Formula.   Purified bottled water.   6-8 bottles (4-5 oz bottles and 8-9 oz bottles).   6-8 bottle nipples.   Bibs and burp cloths.   Bottle brush.   Bottle sterilizer (or a pot with a lid).  Bathing   Infant bath basin.   Mild baby soap and baby shampoo.   Soft cloth towel and washcloth.   Hooded towel.  Diapering   Diapers. You may need to use as many as 10-12 diapers each day.   Baby wipes.   Diaper cream.   Petroleum jelly.   Changing pad.   Hand sanitizer.  Health and safety   Rectal thermometer.   Infant medicines.   Bulb syringe.   Baby nail clippers.   Baby monitor.   2-3 pacifiers, if desired.  Sleeping   Sleep sack or swaddling blanket.   Firm  mattress pad and fitted sheets for the crib or bassinet.  Other supplies   Diaper bag.   Clothing, including one-piece outfits and pajamas.   Receiving blankets.  Follow these instructions at home:  Preparing for an emergency  Prepare for an emergency by taking these steps:   Know when to seek care or call your health care provider.   Know how to get to the nearest hospital.   List the phone numbers of your baby's health care providers near your home phone and in your cell phone.   Take an infant first aid and CPR class.   Place the phone number for the poison control center on your refrigerator.   If there will be caregivers in the home, make sure your phone number, emergency contacts, and address are placed on the refrigerator in case they need to be given to emergency services.  Preparing your family     Create a plan for visitors. Keep your baby away from people who have a cough, fever, or other symptoms of illness.   Prepare freezer meals ahead of time, and ask friends and family to help with meal preparation, errands, and everyday tasks.   If you have other children:  ? Talk with them about the baby coming   Worldwide: www.safekids.org Summary  Planning is important before bringing your baby home from the hospital. You will need to have certain supplies ready before your baby arrives.  You will need to have a rear-facing infant car seat ready prior to bringing your baby home. Have a trained professional check to make sure that it is installed in your car correctly.  Always make sure any products-including  cribs, mattresses, bassinets, or portable cribs and play areas-are safe. Check for recalls on your specific brand and model of crib.  Know when to seek care or call your health care provider, and know how to get to the nearest hospital. This information is not intended to replace advice given to you by your health care provider. Make sure you discuss any questions you have with your health care provider. Document Released: 03/24/2008 Document Revised: 03/01/2017 Document Reviewed: 03/01/2017 Elsevier Interactive Patient Education  2019 [employer]. Cesarean Delivery Cesarean birth, or cesarean delivery, is the surgical delivery of a baby through an incision in the abdomen and the uterus. This may be referred to as a C-section. This procedure may be scheduled ahead of time, or it may be done in an emergency situation. Tell a health care provider about:  Any allergies you have.  All medicines you are taking, including vitamins, herbs, eye drops, creams, and over-the-counter medicines.  Any problems you or family members have had with anesthetic medicines.  Any blood disorders you have.  Any surgeries you have had.  Any medical conditions you have.  Whether you or any members of your family have a history of deep vein thrombosis (DVT) or pulmonary embolism (PE). What are the risks? Generally, this is a safe procedure. However, problems may occur, including:  Infection.  Bleeding.  Allergic reactions to medicines.  Damage to other structures or organs.  Blood clots.  Injury to your baby. What happens before the procedure? General instructions  Follow instructions from your health care provider about eating or drinking restrictions.  If you know that you are going to have a cesarean delivery, do not shave your pubic area. Shaving before the procedure may increase your risk of infection.  Plan to have someone take you home from the hospital.  Ask your health care provider  what steps will be taken to prevent infection. These may include: ? Removing hair at the surgery site. ? Washing skin with a germ-killing soap. ? Taking antibiotic medicine.  Depending on the reason for your cesarean delivery, you may have a physical exam or additional testing, such as an ultrasound.  You may have your blood or urine tested. Questions for your health care provider  Ask your health care provider about: ? Changing or stopping your regular medicines. This is especially important if you are taking diabetes medicines or blood thinners. ? Your pain management plan. This is especially important if you plan to breastfeed your baby. ? How long you will be in the hospital after the procedure. ? Any concerns you may have about receiving blood products, if you need them during the procedure. ? Cord blood banking, if you plan to collect your baby's umbilical cord blood.  You may also want to ask your health care provider: ? Whether you will be able to hold or breastfeed your baby while you are still in the operating room. ? Whether your baby can stay with you immediately after the procedure and during your recovery. ? Whether a family member or a person of your choice can go with  you into the operating room and stay with you during the procedure, immediately after the procedure, and during your recovery. What happens during the procedure?   An IV will be inserted into one of your veins.  Fluid and medicines, such as antibiotics, will be given before the surgery.  Fetal monitors will be placed on your abdomen to check your baby's heart rate.  You may be given a special warming gown to wear to keep your temperature stable.  A catheter may be inserted into your bladder through your urethra. This drains your urine during the procedure.  You may be given one or more of the following: ? A medicine to numb the area (local anesthetic). ? A medicine to make you fall asleep (general  anesthetic). ? A medicine (regional anesthetic) that is injected into your back or through a small thin tube placed in your back (spinal anesthetic or epidural anesthetic). This numbs everything below the injection site and allows you to stay awake during your procedure. If this makes you feel nauseous, tell your health care provider. Medicines will be available to help reduce any nausea you may feel.  An incision will be made in your abdomen, and then in your uterus.  If you are awake during your procedure, you may feel tugging and pulling in your abdomen, but you should not feel pain. If you feel pain, tell your health care provider immediately.  Your baby will be removed from your uterus. You may feel more pressure or pushing while this happens.  Immediately after birth, your baby will be dried and kept warm. You may be able to hold and breastfeed your baby.  The umbilical cord may be clamped and cut during this time. This usually occurs after waiting a period of 1-2 minutes after delivery.  Your placenta will be removed from your uterus.  Your incisions will be closed with stitches (sutures). Staples, skin glue, or adhesive strips may also be applied to the incision in your abdomen.  Bandages (dressings) may be placed over the incision in your abdomen. The procedure may vary among health care providers and hospitals. What happens after the procedure?  Your blood pressure, heart rate, breathing rate, and blood oxygen level will be monitored until you are discharged from the hospital.  You may continue to receive fluids and medicines through an IV.  You will have some pain. Medicines will be available to help control your pain.  To help prevent blood clots: ? You may be given medicines. ? You may have to wear compression stockings or devices. ? You will be encouraged to walk around when you are able.  Hospital staff will encourage and support bonding with your baby. Your hospital  may have you and your baby to stay in the same room (rooming in) during your hospital stay to encourage successful bonding and breastfeeding.  You may be encouraged to cough and breathe deeply often. This helps to prevent lung problems.  If you have a catheter draining your urine, it will be removed as soon as possible after your procedure. Summary  Cesarean birth, or cesarean delivery, is the surgical delivery of a baby through an incision in the abdomen and the uterus.  Follow instructions from your health care provider about eating or drinking restrictions before the procedure.  You will have some pain after the procedure. Medicines will be available to help control your pain.  Hospital staff will encourage and support bonding with your baby after the procedure. Your hospital  may have you and your baby to stay in the same room (rooming in) during your hospital stay to encourage successful bonding and breastfeeding. This information is not intended to replace advice given to you by your health care provider. Make sure you discuss any questions you have with your health care provider. Document Released: 04/11/2005 Document Revised: 10/16/2017 Document Reviewed: 10/16/2017 Elsevier Interactive Patient Education  2019 [employer]

## 2018-05-23 DIAGNOSIS — Z029 Encounter for administrative examinations, unspecified: Secondary | ICD-10-CM

## 2018-05-25 ENCOUNTER — Other Ambulatory Visit: Payer: Self-pay

## 2018-05-25 ENCOUNTER — Encounter: Payer: Self-pay | Admitting: Advanced Practice Midwife

## 2018-05-25 ENCOUNTER — Inpatient Hospital Stay (EMERGENCY_DEPARTMENT_HOSPITAL)
Admission: AD | Admit: 2018-05-25 | Discharge: 2018-05-25 | Disposition: A | Discharge status: Home / Self Care | Payer: Medicaid Other | Source: Home / Self Care | Attending: Obstetrics and Gynecology | Admitting: Obstetrics and Gynecology

## 2018-05-25 ENCOUNTER — Encounter (HOSPITAL_COMMUNITY): Payer: Self-pay | Admitting: *Deleted

## 2018-05-25 DIAGNOSIS — R310 Gross hematuria: Secondary | ICD-10-CM | POA: Insufficient documentation

## 2018-05-25 DIAGNOSIS — O479 False labor, unspecified: Secondary | ICD-10-CM

## 2018-05-25 DIAGNOSIS — O471 False labor at or after 37 completed weeks of gestation: Secondary | ICD-10-CM

## 2018-05-25 DIAGNOSIS — O26853 Spotting complicating pregnancy, third trimester: Secondary | ICD-10-CM

## 2018-05-25 DIAGNOSIS — Z3A38 38 weeks gestation of pregnancy: Secondary | ICD-10-CM

## 2018-05-25 LAB — URINALYSIS, ROUTINE W REFLEX MICROSCOPIC
Bilirubin Urine: NEGATIVE
Glucose, UA: NEGATIVE mg/dL
Hgb urine dipstick: NEGATIVE
Ketones, ur: NEGATIVE mg/dL
Leukocytes, UA: NEGATIVE
NITRITE: NEGATIVE
Protein, ur: NEGATIVE mg/dL
Specific Gravity, Urine: 1.01 (ref 1.005–1.030)
pH: 7 (ref 5.0–8.0)

## 2018-05-25 NOTE — Discharge Instructions (Signed)
Braxton Hicks Contractions Contractions of the uterus can occur throughout pregnancy, but they are not always a sign that you are in labor. You may have practice contractions called Braxton Hicks contractions. These false labor contractions are sometimes confused with true labor. What are Braxton Hicks contractions? Braxton Hicks contractions are tightening movements that occur in the muscles of the uterus before labor. Unlike true labor contractions, these contractions do not result in opening (dilation) and thinning of the cervix. Toward the end of pregnancy (32-34 weeks), Braxton Hicks contractions can happen more often and may become stronger. These contractions are sometimes difficult to tell apart from true labor because they can be very uncomfortable. You should not feel embarrassed if you go to the hospital with false labor. Sometimes, the only way to tell if you are in true labor is for your health care provider to look for changes in the cervix. The health care provider will do a physical exam and may monitor your contractions. If you are not in true labor, the exam should show that your cervix is not dilating and your water has not broken. If there are no other health problems associated with your pregnancy, it is completely safe for you to be sent home with false labor. You may continue to have Braxton Hicks contractions until you go into true labor. How to tell the difference between true labor and false labor True labor  Contractions last 30-70 seconds.  Contractions become very regular.  Discomfort is usually felt in the top of the uterus, and it spreads to the lower abdomen and low back.  Contractions do not go away with walking.  Contractions usually become more intense and increase in frequency.  The cervix dilates and gets thinner. False labor  Contractions are usually shorter and not as strong as true labor contractions.  Contractions are usually irregular.  Contractions  are often felt in the front of the lower abdomen and in the groin.  Contractions may go away when you walk around or change positions while lying down.  Contractions get weaker and are shorter-lasting as time goes on.  The cervix usually does not dilate or become thin. Follow these instructions at home:   Take over-the-counter and prescription medicines only as told by your health care provider.  Keep up with your usual exercises and follow other instructions from your health care provider.  Eat and drink lightly if you think you are going into labor.  If Braxton Hicks contractions are making you uncomfortable: ? Change your position from lying down or resting to walking, or change from walking to resting. ? Sit and rest in a tub of warm water. ? Drink enough fluid to keep your urine pale yellow. Dehydration may cause these contractions. ? Do slow and deep breathing several times an hour.  Keep all follow-up prenatal visits as told by your health care provider. This is important. Contact a health care provider if:  You have a fever.  You have continuous pain in your abdomen. Get help right away if:  Your contractions become stronger, more regular, and closer together.  You have fluid leaking or gushing from your vagina.  You pass blood-tinged mucus (bloody show).  You have bleeding from your vagina.  You have low back pain that you never had before.  You feel your baby's head pushing down and causing pelvic pressure.  Your baby is not moving inside you as much as it used to. Summary  Contractions that occur before labor are   called Braxton Hicks contractions, false labor, or practice contractions.  Braxton Hicks contractions are usually shorter, weaker, farther apart, and less regular than true labor contractions. True labor contractions usually become progressively stronger and regular, and they become more frequent.  Manage discomfort from Braxton Hicks contractions  by changing position, resting in a warm bath, drinking plenty of water, or practicing deep breathing. This information is not intended to replace advice given to you by your health care provider. Make sure you discuss any questions you have with your health care provider. Document Released: 08/25/2016 Document Revised: 01/24/2017 Document Reviewed: 08/25/2016 Elsevier Interactive Patient Education  2019 Elsevier Inc.  

## 2018-05-25 NOTE — MAU Note (Addendum)
Had a pain/ ? Ctx early this morning that woke her.  Got up and went to the bathroom, noted blood in her urine.  Both have stopped, no more bleeding or pain.  Sent a message to nurse and was told to come get checked out.  Cervix checked when seen on Tues.  No placental issues on [place]. abd soft on palpation

## 2018-05-25 NOTE — MAU Provider Note (Addendum)
History     CSN: [id number]  Arrival date and time: 05/25/18 1311   First Provider Initiated Contact with Patient 05/25/18 1358      Chief Complaint  Patient presents with  . Hematuria   Brooke Gregory is a 28 y.o. G3P2002 at 5225w1d who presents today with bloody show and cramping. She states that she woke up this morning and felt a sharp pain. Then when she went to the bathroom there was some blood noted. She isn't sure if it was in her urine or from somwhere else. She states that the pain then went away and has not returned. She has not noted any more bleeding. She denies any LOF. She reports normal fetal movement. She was 1cm in the office a couple of days ago. She is scheduled for repeat c-section on 05/31/18   Hematuria  This is a new problem. The current episode started today. The problem has been resolved since onset. She describes the hematuria as gross hematuria. She reports no clotting in her urine stream. Her pain is at a severity of 0/10. She is experiencing no pain. Irritative symptoms do not include frequency. Pertinent negatives include no chills, dysuria, fever, nausea or vomiting.    OB History    Gravida  3   Para  2   Term  2   Preterm      AB      Living  2     SAB      TAB      Ectopic      Multiple      Live Births  2           Past Medical History:  Diagnosis Date  . Gestational diabetes     Past Surgical History:  Procedure Laterality Date  . CESAREAN SECTION      Family History  Problem Relation Age of Onset  . Hypertension Mother   . Diabetes Mother   . Kidney disease Maternal Grandmother     Social History   Tobacco Use  . Smoking status: Never Smoker  . Smokeless tobacco: Never Used  Substance Use Topics  . Alcohol use: Never    Frequency: Never  . Drug use: Never    Allergies: No Known Allergies  Medications Prior to Admission  Medication Sig Dispense Refill Last Dose  . ACCU-CHEK FASTCLIX LANCETS MISC 1  Device by Percutaneous route 4 (four) times daily. 100 each 12 05/25/2018 at Unknown time  . ferrous sulfate 325 (65 FE) MG tablet Take 1 tablet (325 mg total) by mouth daily with breakfast. 30 tablet 3 05/25/2018 at Unknown time  . glucose blood (ACCU-CHEK GUIDE) test strip Use four times per day 100 each 12 05/25/2018 at Unknown time  . Prenatal Vit-Fe Fumarate-FA (MULTIVITAMIN-PRENATAL) 27-0.8 MG TABS tablet Take 1 tablet by mouth daily at 12 noon.   05/25/2018 at Unknown time  . Elastic Bandages & Supports (COMFORT FIT MATERNITY SUPP MED) MISC 1 Device by Does not apply route daily. (Patient not taking: Reported on 03/06/2018) 1 each 0 Not Taking at Unknown time    Review of Systems  Constitutional: Negative for chills and fever.  Gastrointestinal: Negative for nausea and vomiting.  Genitourinary: Positive for hematuria. Negative for dysuria, frequency, pelvic pain, vaginal bleeding and vaginal discharge.   Physical Exam   Blood pressure 134/79, pulse (!) 101, temperature 98.4 F (36.9 C), temperature source Oral, resp. rate 16, weight 104.2 kg, last menstrual period 09/23/2017, SpO2 100 %.  Physical Exam  Nursing note and vitals reviewed. Constitutional: She is oriented to person, place, and time. She appears well-developed and well-nourished. No distress.  HENT:  Head: Normocephalic.  Cardiovascular: Normal rate.  Respiratory: Effort normal.  GI: Soft. There is no abdominal tenderness. There is no rebound.  Neurological: She is alert and oriented to person, place, and time.  Skin: Skin is warm and dry.   Dilation: 1 Effacement (%): Thick Station: Ballotable Exam by:: Zorita Pang, CNM  NST:  Baseline: 140 Variability: moderate Accels: 15x15 Decels: none Toco: 2-4   Results for orders placed or performed during the hospital encounter of 05/25/18 (from the past 24 hour(s))  Urinalysis, Routine w reflex microscopic     Status: Abnormal   Collection Time: 05/25/18  1:52 PM  Result  Value Ref Range   Color, Urine YELLOW YELLOW   APPearance HAZY (A) CLEAR   Specific Gravity, Urine 1.010 1.005 - 1.030   pH 7.0 5.0 - 8.0   Glucose, UA NEGATIVE NEGATIVE mg/dL   Hgb urine dipstick NEGATIVE NEGATIVE   Bilirubin Urine NEGATIVE NEGATIVE   Ketones, ur NEGATIVE NEGATIVE mg/dL   Protein, ur NEGATIVE NEGATIVE mg/dL   Nitrite NEGATIVE NEGATIVE   Leukocytes, UA NEGATIVE NEGATIVE     MAU Course  Procedures  MDM Patient with no cervical change over 2+hours.   Assessment and Plan   1. Spotting affecting pregnancy in third trimester   2. [redacted] weeks gestation of pregnancy   3. Braxton Hicks contractions    DC home Comfort measures reviewed  3rd Trimester precautions  Labor precautions  Fetal kick counts RX: none  Return to MAU as needed FU with OB as planned  Follow-up Information    [facility] Follow up.   Specialty:  Obstetrics and Gynecology Contact information: [address] [place] [address] [phone]           Thressa Sheller DNP, CNM  05/25/18  4:16 PM

## 2018-05-26 ENCOUNTER — Inpatient Hospital Stay (HOSPITAL_COMMUNITY)
Admission: AD | Admit: 2018-05-26 | Discharge: 2018-05-29 | DRG: 785 | Disposition: A | Discharge status: Home / Self Care | Payer: Medicaid Other | Attending: Obstetrics & Gynecology | Admitting: Obstetrics & Gynecology

## 2018-05-26 ENCOUNTER — Inpatient Hospital Stay (HOSPITAL_COMMUNITY): Payer: Medicaid Other | Admitting: Certified Registered Nurse Anesthetist

## 2018-05-26 ENCOUNTER — Encounter (HOSPITAL_COMMUNITY)
Admission: AD | Disposition: A | Discharge status: Home / Self Care | Payer: Self-pay | Source: Home / Self Care | Attending: Obstetrics & Gynecology

## 2018-05-26 ENCOUNTER — Encounter (HOSPITAL_COMMUNITY): Payer: Self-pay

## 2018-05-26 ENCOUNTER — Other Ambulatory Visit: Payer: Self-pay

## 2018-05-26 DIAGNOSIS — O26853 Spotting complicating pregnancy, third trimester: Secondary | ICD-10-CM | POA: Diagnosis not present

## 2018-05-26 DIAGNOSIS — O4292 Full-term premature rupture of membranes, unspecified as to length of time between rupture and onset of labor: Secondary | ICD-10-CM | POA: Diagnosis present

## 2018-05-26 DIAGNOSIS — O99824 Streptococcus B carrier state complicating childbirth: Secondary | ICD-10-CM | POA: Diagnosis present

## 2018-05-26 DIAGNOSIS — O321XX0 Maternal care for breech presentation, not applicable or unspecified: Secondary | ICD-10-CM | POA: Diagnosis present

## 2018-05-26 DIAGNOSIS — O34211 Maternal care for low transverse scar from previous cesarean delivery: Secondary | ICD-10-CM | POA: Diagnosis present

## 2018-05-26 DIAGNOSIS — O24420 Gestational diabetes mellitus in childbirth, diet controlled: Secondary | ICD-10-CM | POA: Diagnosis present

## 2018-05-26 DIAGNOSIS — Z302 Encounter for sterilization: Secondary | ICD-10-CM | POA: Diagnosis not present

## 2018-05-26 DIAGNOSIS — O99214 Obesity complicating childbirth: Secondary | ICD-10-CM | POA: Diagnosis present

## 2018-05-26 DIAGNOSIS — Z3A38 38 weeks gestation of pregnancy: Secondary | ICD-10-CM

## 2018-05-26 DIAGNOSIS — O471 False labor at or after 37 completed weeks of gestation: Secondary | ICD-10-CM | POA: Diagnosis not present

## 2018-05-26 DIAGNOSIS — Z98891 History of uterine scar from previous surgery: Secondary | ICD-10-CM

## 2018-05-26 DIAGNOSIS — O34219 Maternal care for unspecified type scar from previous cesarean delivery: Secondary | ICD-10-CM | POA: Diagnosis present

## 2018-05-26 LAB — ABO/RH: ABO/RH(D): O POS

## 2018-05-26 LAB — URINALYSIS, MICROSCOPIC (REFLEX)

## 2018-05-26 LAB — CBC
HCT: 32.6 % — ABNORMAL LOW (ref 36.0–46.0)
Hemoglobin: 10.2 g/dL — ABNORMAL LOW (ref 12.0–15.0)
MCH: 25.4 pg — ABNORMAL LOW (ref 26.0–34.0)
MCHC: 31.3 g/dL (ref 30.0–36.0)
MCV: 81.3 fL (ref 80.0–100.0)
Platelets: 390 10*3/uL (ref 150–400)
RBC: 4.01 MIL/uL (ref 3.87–5.11)
RDW: 15.9 % — ABNORMAL HIGH (ref 11.5–15.5)
WBC: 12.2 10*3/uL — ABNORMAL HIGH (ref 4.0–10.5)
nRBC: 0 % (ref 0.0–0.2)

## 2018-05-26 LAB — URINALYSIS, ROUTINE W REFLEX MICROSCOPIC
Bilirubin Urine: NEGATIVE
Glucose, UA: NEGATIVE mg/dL
Ketones, ur: NEGATIVE mg/dL
Nitrite: NEGATIVE
Protein, ur: NEGATIVE mg/dL
pH: 5 (ref 5.0–8.0)

## 2018-05-26 LAB — CREATININE, SERUM
Creatinine, Ser: 0.49 mg/dL (ref 0.44–1.00)
GFR calc Af Amer: >60 mL/min
GFR calc non Af Amer: >60 mL/min

## 2018-05-26 LAB — TYPE AND SCREEN
ABO/RH(D): O POS
Antibody Screen: NEGATIVE

## 2018-05-26 LAB — POCT FERN TEST: POCT Fern Test: POSITIVE

## 2018-05-26 SURGERY — Surgical Case
Anesthesia: Spinal | Laterality: Bilateral | Wound class: Clean Contaminated

## 2018-05-26 MED ORDER — IBUPROFEN 800 MG PO TABS
800.0000 mg | ORAL_TABLET | Freq: Three times a day (TID) | ORAL | Status: DC
  Administered 2018-05-26 – 2018-05-29 (×8): 800 mg via ORAL
  Filled 2018-05-26 (×8): qty 1

## 2018-05-26 MED ORDER — SOD CITRATE-CITRIC ACID 500-334 MG/5ML PO SOLN
ORAL | Status: AC
  Filled 2018-05-26: qty 15

## 2018-05-26 MED ORDER — NALOXONE HCL 4 MG/10ML IJ SOLN
1.0000 ug/kg/h | INTRAVENOUS | Status: DC | PRN
  Filled 2018-05-26: qty 5

## 2018-05-26 MED ORDER — PROMETHAZINE HCL 25 MG/ML IJ SOLN: 6.2500 mg | INTRAMUSCULAR | Status: DC | PRN

## 2018-05-26 MED ORDER — TETANUS-DIPHTH-ACELL PERTUSSIS 5-2.5-18.5 LF-MCG/0.5 IM SUSP: 0.5000 mL | Freq: Once | INTRAMUSCULAR | Status: DC

## 2018-05-26 MED ORDER — COCONUT OIL OIL
1.0000 "application " | TOPICAL_OIL | Status: DC | PRN
  Administered 2018-05-28: 1 via TOPICAL
  Filled 2018-05-26: qty 120

## 2018-05-26 MED ORDER — ENOXAPARIN SODIUM 60 MG/0.6ML ~~LOC~~ SOLN
0.5000 mg/kg | SUBCUTANEOUS | Status: DC
  Administered 2018-05-27 – 2018-05-29 (×2): 50 mg via SUBCUTANEOUS
  Filled 2018-05-26 (×3): qty 0.6

## 2018-05-26 MED ORDER — GABAPENTIN 100 MG PO CAPS
100.0000 mg | ORAL_CAPSULE | Freq: Three times a day (TID) | ORAL | Status: DC
  Administered 2018-05-26 – 2018-05-28 (×6): 100 mg via ORAL
  Filled 2018-05-26 (×9): qty 1

## 2018-05-26 MED ORDER — LACTATED RINGERS IV BOLUS: 1000.0000 mL | Freq: Once | INTRAVENOUS | Status: DC

## 2018-05-26 MED ORDER — FENTANYL CITRATE (PF) 100 MCG/2ML IJ SOLN
INTRAMUSCULAR | Status: DC | PRN
  Administered 2018-05-26: 15 ug via INTRAVENOUS

## 2018-05-26 MED ORDER — KETOROLAC TROMETHAMINE 30 MG/ML IJ SOLN: 30.0000 mg | Freq: Four times a day (QID) | INTRAMUSCULAR | Status: AC | PRN

## 2018-05-26 MED ORDER — MEPERIDINE HCL 25 MG/ML IJ SOLN: 6.2500 mg | INTRAMUSCULAR | Status: DC | PRN

## 2018-05-26 MED ORDER — ONDANSETRON HCL 4 MG/2ML IJ SOLN: 4.0000 mg | Freq: Three times a day (TID) | INTRAMUSCULAR | Status: DC | PRN

## 2018-05-26 MED ORDER — NALBUPHINE HCL 10 MG/ML IJ SOLN: 5.0000 mg | Freq: Once | INTRAMUSCULAR | Status: DC | PRN

## 2018-05-26 MED ORDER — LACTATED RINGERS IV SOLN
INTRAVENOUS | Status: DC | PRN
  Administered 2018-05-26 (×3): via INTRAVENOUS

## 2018-05-26 MED ORDER — SCOPOLAMINE 1 MG/3DAYS TD PT72
MEDICATED_PATCH | TRANSDERMAL | Status: DC | PRN
  Administered 2018-05-26: 1 via TRANSDERMAL

## 2018-05-26 MED ORDER — OXYTOCIN 40 UNITS IN NORMAL SALINE INFUSION - SIMPLE MED: 2.5000 [IU]/h | INTRAVENOUS | Status: AC

## 2018-05-26 MED ORDER — NALOXONE HCL 0.4 MG/ML IJ SOLN: 0.4000 mg | INTRAMUSCULAR | Status: DC | PRN

## 2018-05-26 MED ORDER — OXYTOCIN 10 UNIT/ML IJ SOLN
INTRAMUSCULAR | Status: AC
  Filled 2018-05-26: qty 4

## 2018-05-26 MED ORDER — ERYTHROMYCIN 5 MG/GM OP OINT
TOPICAL_OINTMENT | OPHTHALMIC | Status: AC
  Filled 2018-05-26: qty 1

## 2018-05-26 MED ORDER — SIMETHICONE 80 MG PO CHEW
80.0000 mg | CHEWABLE_TABLET | ORAL | Status: DC
  Administered 2018-05-27 – 2018-05-28 (×3): 80 mg via ORAL
  Filled 2018-05-26 (×3): qty 1

## 2018-05-26 MED ORDER — SCOPOLAMINE 1 MG/3DAYS TD PT72: 1.0000 | MEDICATED_PATCH | Freq: Once | TRANSDERMAL | Status: DC

## 2018-05-26 MED ORDER — PHENYLEPHRINE 8 MG IN D5W 100 ML (0.08MG/ML) PREMIX OPTIME
INJECTION | INTRAVENOUS | Status: DC | PRN
  Administered 2018-05-26: 60 ug/min via INTRAVENOUS

## 2018-05-26 MED ORDER — DIPHENHYDRAMINE HCL 50 MG/ML IJ SOLN: 12.5000 mg | INTRAMUSCULAR | Status: DC | PRN

## 2018-05-26 MED ORDER — OXYCODONE HCL 5 MG PO TABS
5.0000 mg | ORAL_TABLET | ORAL | Status: DC | PRN
  Administered 2018-05-28: 5 mg via ORAL
  Administered 2018-05-29: 10 mg via ORAL
  Filled 2018-05-26: qty 1
  Filled 2018-05-26: qty 2

## 2018-05-26 MED ORDER — FENTANYL CITRATE (PF) 100 MCG/2ML IJ SOLN
INTRAMUSCULAR | Status: AC
  Filled 2018-05-26: qty 2

## 2018-05-26 MED ORDER — KETOROLAC TROMETHAMINE 30 MG/ML IJ SOLN: 30.0000 mg | Freq: Once | INTRAMUSCULAR | Status: DC | PRN

## 2018-05-26 MED ORDER — SIMETHICONE 80 MG PO CHEW: 80.0000 mg | CHEWABLE_TABLET | ORAL | Status: DC | PRN

## 2018-05-26 MED ORDER — PHENYLEPHRINE 8 MG IN D5W 100 ML (0.08MG/ML) PREMIX OPTIME
INJECTION | INTRAVENOUS | Status: AC
  Filled 2018-05-26: qty 100

## 2018-05-26 MED ORDER — BUPIVACAINE IN DEXTROSE 0.75-8.25 % IT SOLN
INTRATHECAL | Status: DC | PRN
  Administered 2018-05-26: 1.4 mL via INTRATHECAL

## 2018-05-26 MED ORDER — MORPHINE SULFATE (PF) 0.5 MG/ML IJ SOLN
INTRAMUSCULAR | Status: AC
  Filled 2018-05-26: qty 10

## 2018-05-26 MED ORDER — PRENATAL MULTIVITAMIN CH
1.0000 | ORAL_TABLET | Freq: Every day | ORAL | Status: DC
  Administered 2018-05-27 – 2018-05-28 (×2): 1 via ORAL
  Filled 2018-05-26 (×2): qty 1

## 2018-05-26 MED ORDER — LACTATED RINGERS IV SOLN
INTRAVENOUS | Status: DC
  Administered 2018-05-27: 01:00:00 via INTRAVENOUS

## 2018-05-26 MED ORDER — ONDANSETRON HCL 4 MG/2ML IJ SOLN
INTRAMUSCULAR | Status: DC | PRN
  Administered 2018-05-26: 4 mg via INTRAVENOUS

## 2018-05-26 MED ORDER — MORPHINE SULFATE (PF) 0.5 MG/ML IJ SOLN
INTRAMUSCULAR | Status: DC | PRN
  Administered 2018-05-26: 150 ug via INTRATHECAL

## 2018-05-26 MED ORDER — ENOXAPARIN SODIUM 40 MG/0.4ML ~~LOC~~ SOLN: 40.0000 mg | SUBCUTANEOUS | Status: DC

## 2018-05-26 MED ORDER — SIMETHICONE 80 MG PO CHEW
80.0000 mg | CHEWABLE_TABLET | Freq: Three times a day (TID) | ORAL | Status: DC
  Administered 2018-05-26 – 2018-05-28 (×5): 80 mg via ORAL
  Filled 2018-05-26 (×6): qty 1

## 2018-05-26 MED ORDER — DIPHENHYDRAMINE HCL 25 MG PO CAPS
25.0000 mg | ORAL_CAPSULE | ORAL | Status: DC | PRN
  Filled 2018-05-26: qty 1

## 2018-05-26 MED ORDER — DIBUCAINE 1 % RE OINT: 1.0000 "application " | TOPICAL_OINTMENT | RECTAL | Status: DC | PRN

## 2018-05-26 MED ORDER — ACETAMINOPHEN 500 MG PO TABS
1000.0000 mg | ORAL_TABLET | Freq: Four times a day (QID) | ORAL | Status: AC
  Administered 2018-05-26 – 2018-05-27 (×3): 1000 mg via ORAL
  Filled 2018-05-26 (×3): qty 2

## 2018-05-26 MED ORDER — SENNOSIDES-DOCUSATE SODIUM 8.6-50 MG PO TABS
2.0000 | ORAL_TABLET | ORAL | Status: DC
  Administered 2018-05-27 – 2018-05-28 (×3): 2 via ORAL
  Filled 2018-05-26 (×3): qty 2

## 2018-05-26 MED ORDER — SODIUM CHLORIDE 0.9% FLUSH: 3.0000 mL | INTRAVENOUS | Status: DC | PRN

## 2018-05-26 MED ORDER — WITCH HAZEL-GLYCERIN EX PADS: 1.0000 "application " | MEDICATED_PAD | CUTANEOUS | Status: DC | PRN

## 2018-05-26 MED ORDER — CEFAZOLIN SODIUM-DEXTROSE 2-4 GM/100ML-% IV SOLN
2.0000 g | INTRAVENOUS | Status: AC
  Administered 2018-05-26: 2 g via INTRAVENOUS
  Filled 2018-05-26: qty 100

## 2018-05-26 MED ORDER — DIPHENHYDRAMINE HCL 25 MG PO CAPS: 25.0000 mg | ORAL_CAPSULE | Freq: Four times a day (QID) | ORAL | Status: DC | PRN

## 2018-05-26 MED ORDER — ONDANSETRON HCL 4 MG/2ML IJ SOLN
INTRAMUSCULAR | Status: AC
  Filled 2018-05-26: qty 2

## 2018-05-26 MED ORDER — NALBUPHINE HCL 10 MG/ML IJ SOLN: 5.0000 mg | INTRAMUSCULAR | Status: DC | PRN

## 2018-05-26 MED ORDER — MENTHOL 3 MG MT LOZG: 1.0000 | LOZENGE | OROMUCOSAL | Status: DC | PRN

## 2018-05-26 MED ORDER — HYDROMORPHONE HCL 1 MG/ML IJ SOLN: 0.2500 mg | INTRAMUSCULAR | Status: DC | PRN

## 2018-05-26 MED ORDER — LACTATED RINGERS IV SOLN
INTRAVENOUS | Status: DC
  Administered 2018-05-26: 12:00:00 via INTRAVENOUS

## 2018-05-26 MED ORDER — OXYTOCIN 10 UNIT/ML IJ SOLN
INTRAVENOUS | Status: DC | PRN
  Administered 2018-05-26: 40 [IU] via INTRAVENOUS

## 2018-05-26 MED ORDER — DEXAMETHASONE SODIUM PHOSPHATE 4 MG/ML IJ SOLN
INTRAMUSCULAR | Status: DC | PRN
  Administered 2018-05-26: 4 mg via INTRAVENOUS

## 2018-05-26 SURGICAL SUPPLY — 35 items
BARRIER ADHS 3X4 INTERCEED (GAUZE/BANDAGES/DRESSINGS) IMPLANT
BENZOIN TINCTURE PRP APPL 2/3 (GAUZE/BANDAGES/DRESSINGS) ×3 IMPLANT
CLAMP CORD UMBIL (MISCELLANEOUS) IMPLANT
CLOSURE WOUND 1/2 X4 (GAUZE/BANDAGES/DRESSINGS) ×1
CLOTH BEACON ORANGE TIMEOUT ST (SAFETY) ×3 IMPLANT
DRSG OPSITE POSTOP 4X10 (GAUZE/BANDAGES/DRESSINGS) ×3 IMPLANT
ELECT REM PT RETURN 9FT ADLT (ELECTROSURGICAL) ×3
ELECTRODE REM PT RTRN 9FT ADLT (ELECTROSURGICAL) ×1 IMPLANT
EXTRACTOR VACUUM KIWI (MISCELLANEOUS) IMPLANT
GLOVE BIO SURGEON STRL SZ 6.5 (GLOVE) ×2 IMPLANT
GLOVE BIO SURGEONS STRL SZ 6.5 (GLOVE) ×1
GLOVE BIOGEL PI IND STRL 7.0 (GLOVE) ×2 IMPLANT
GLOVE BIOGEL PI INDICATOR 7.0 (GLOVE) ×4
GOWN STRL REUS W/TWL LRG LVL3 (GOWN DISPOSABLE) ×6 IMPLANT
KIT ABG SYR 3ML LUER SLIP (SYRINGE) IMPLANT
NEEDLE HYPO 22GX1.5 SAFETY (NEEDLE) IMPLANT
NEEDLE HYPO 25X5/8 SAFETYGLIDE (NEEDLE) IMPLANT
NS IRRIG 1000ML POUR BTL (IV SOLUTION) ×3 IMPLANT
PACK C SECTION WH (CUSTOM PROCEDURE TRAY) ×3 IMPLANT
PAD ABD 8X10 STRL (GAUZE/BANDAGES/DRESSINGS) ×3 IMPLANT
PAD OB MATERNITY 4.3X12.25 (PERSONAL CARE ITEMS) ×3 IMPLANT
PENCIL SMOKE EVAC W/HOLSTER (ELECTROSURGICAL) ×3 IMPLANT
RETRACTOR TRAXI PANNICULUS (MISCELLANEOUS) ×1 IMPLANT
RTRCTR C-SECT PINK 25CM LRG (MISCELLANEOUS) IMPLANT
SPONGE GAUZE 4X4 12PLY STER LF (GAUZE/BANDAGES/DRESSINGS) ×3 IMPLANT
STRIP CLOSURE SKIN 1/2X4 (GAUZE/BANDAGES/DRESSINGS) ×2 IMPLANT
SUT VIC AB 0 CT1 36 (SUTURE) ×18 IMPLANT
SUT VIC AB 2-0 CT1 27 (SUTURE) ×2
SUT VIC AB 2-0 CT1 TAPERPNT 27 (SUTURE) ×1 IMPLANT
SUT VIC AB 4-0 PS2 27 (SUTURE) ×3 IMPLANT
SYR CONTROL 10ML LL (SYRINGE) IMPLANT
TOWEL OR 17X24 6PK STRL BLUE (TOWEL DISPOSABLE) ×3 IMPLANT
TRAXI PANNICULUS RETRACTOR (MISCELLANEOUS) ×2
TRAY FOLEY W/BAG SLVR 14FR LF (SET/KITS/TRAYS/PACK) ×3 IMPLANT
WATER STERILE IRR 1000ML POUR (IV SOLUTION) ×3 IMPLANT

## 2018-05-26 NOTE — Progress Notes (Signed)
Lab at bedside drawing type and screen and labs in PACU

## 2018-05-26 NOTE — Lactation Note (Signed)
This note was copied from a baby's chart. Lactation Consultation Note  Patient Name: Girl Anda Kraftyler Schwall ZOXWR'UToday's Date: 05/26/2018 Reason for consult: Initial assessment;Early term 37-38.6wks;Maternal endocrine disorder Type of Endocrine Disorder?: Diabetes(GDM)  8 hours old early term female who is being exclusively BF by her mother, she's a P3 and experienced BF. She didn't BF her first baby but BF her second one for 7 months. Mom also had GDM on her last pregnancy but didn't experience any BF difficulties or delay in her milk coming in. She doesn't have a pump at home at this point, she came as breast only but she's already thinking about supplementing with formula because baby keeps cueing and she constantly wants to feed. Explained normal newborn behavior and offered to set up a DEBP in case she would like to start pumping early on while at the hospital. Mom is not ready to make a decision yet, but will let her RN know when she does.  Mom already nursing baby on cradle position when entering the room, noticed that latch was shallows and heard multiple clicking noises. LC repositioned baby but clicking and dimpling on the cheeks did not stop. LC unlatched baby, then reposition to the other breast in cross cradle position, and it was still the same, baby will not click for the first 5-10 seconds but after that, noticeable dimpling on the cheeks were also observed. When LC checked baby's sucking patterned noticed that she was doing the exact same thing in a gloved finger, clicking noises were also noticed probably due to potential restriction in baby's mouth. However mom is not experiencing any pain or discomfort at this time, unlike noted on her last LATCH score.   When assisting with hand expression, she was able to get colostrum, LC showed mom how to finger feed baby. Baby is cueing and mom put her back to the breast, she voiced the clicking doesn't bother her and it's not causing any pain or  discomfort. Asked mom to call for assistance when needed. Discussed cluster feeding and baby's sleeping cycle.  Feeding plan:  1. Encouraged mom to feed baby STS 8-12 times/24 hours or sooner if feeding cues are present 2. Hand expression and finger feeding were also encouraged 3. Mom will let her RN know when she's ready to start double pumping  BF brochure, BF resources and feeding diary were reviewed. Mom reported all questions and concerns were answered, she's aware of LC services and will call PRN.  Maternal Data Formula Feeding for Exclusion: No Has patient been taught Hand Expression?: Yes Does the patient have breastfeeding experience prior to this delivery?: Yes  Feeding Feeding Type: Breast Fed  LATCH Score Latch: Grasps breast easily, tongue down, lips flanged, rhythmical sucking.  Audible Swallowing: None(hard to hear, baby keeps "clicking and dimpling" after repositioning multiple times)  Type of Nipple: Everted at rest and after stimulation  Comfort (Breast/Nipple): Soft / non-tender  Hold (Positioning): Assistance needed to correctly position infant at breast and maintain latch.  LATCH Score: 7  Interventions Interventions: Breast feeding basics reviewed;Assisted with latch;Skin to skin;Breast massage;Hand express;Breast compression;Adjust position;Support pillows  Lactation Tools Discussed/Used WIC Program: No   Consult Status Consult Status: Follow-up Date: 05/27/18 Follow-up type: In-patient    Sharmaine Bain Venetia ConstableS Charlise Giovanetti 05/26/2018, 8:12 PM

## 2018-05-26 NOTE — MAU Note (Signed)
Pt states ctx have been on going since yesterday at 4pm.  Pt denies LOF or vag bleeding, reports good fetal movement.

## 2018-05-26 NOTE — Progress Notes (Signed)
Lab called to facilitate drawing blood and type and screen on patient in PACU

## 2018-05-26 NOTE — Anesthesia Preprocedure Evaluation (Signed)
Anesthesia Evaluation  Patient identified by MRN, date of birth, ID band Patient awake    Reviewed: Allergy & Precautions, H&P , NPO status , Patient's Chart, lab work & pertinent test results  Airway Mallampati: III  TM Distance: >3 FB Neck ROM: full    Dental no notable dental hx. (+) Teeth Intact   Pulmonary neg pulmonary ROS,    Pulmonary exam normal breath sounds clear to auscultation       Cardiovascular negative cardio ROS Normal cardiovascular exam Rhythm:regular Rate:Normal     Neuro/Psych negative neurological ROS  negative psych ROS   GI/Hepatic negative GI ROS, Neg liver ROS,   Endo/Other  diabetes, GestationalMorbid obesity  Renal/GU negative Renal ROS  negative genitourinary   Musculoskeletal   Abdominal (+) + obese,   Peds  Hematology negative hematology ROS (+)   Anesthesia Other Findings   Reproductive/Obstetrics (+) Pregnancy                             Anesthesia Physical Anesthesia Plan  ASA: III  Anesthesia Plan: Spinal   Post-op Pain Management:    Induction:   PONV Risk Score and Plan: Ondansetron, Dexamethasone and Scopolamine patch - Pre-op  Airway Management Planned: Nasal Cannula and Natural Airway  Additional Equipment:   Intra-op Plan:   Post-operative Plan:   Informed Consent: I have reviewed the patients History and Physical, chart, labs and discussed the procedure including the risks, benefits and alternatives for the proposed anesthesia with the patient or authorized representative who has indicated his/her understanding and acceptance.       Plan Discussed with: CRNA  Anesthesia Plan Comments:         Anesthesia Quick Evaluation

## 2018-05-26 NOTE — Anesthesia Postprocedure Evaluation (Signed)
Anesthesia Post Note  Patient: Brooke Gregory  Procedure(s) Performed: CESAREAN SECTION WITH BILATERAL TUBAL LIGATION (Bilateral )     Patient location during evaluation: Mother Baby Anesthesia Type: Spinal Level of consciousness: awake Pain management: pain level controlled Vital Signs Assessment: post-procedure vital signs reviewed and stable Respiratory status: spontaneous breathing Cardiovascular status: stable Postop Assessment: no headache, no backache, spinal receding, patient able to bend at knees and no apparent nausea or vomiting Anesthetic complications: no    Last Vitals:  Vitals:   05/26/18 1630 05/26/18 1812  BP: 122/78 128/90  Pulse: 86 90  Resp: 16 16  Temp: 37 C 37 C  SpO2:      Last Pain:  Vitals:   05/26/18 2024  TempSrc:   PainSc: 5    Pain Goal:                Epidural/Spinal Function Cutaneous sensation: Normal sensation (05/26/18 2024), Patient able to flex knees: Yes (05/26/18 2024), Patient able to lift hips off bed: Yes (05/26/18 2024), Back pain beyond tenderness at insertion site: No (05/26/18 2024), Progressively worsening motor and/or sensory loss: No (05/26/18 2024), Bowel and/or bladder incontinence post epidural: No (05/26/18 2024)  Caren Macadam

## 2018-05-26 NOTE — Transfer of Care (Signed)
Immediate Anesthesia Transfer of Care Note  Patient: Brooke Gregory  Procedure(s) Performed: CESAREAN SECTION WITH BILATERAL TUBAL LIGATION (Bilateral )  Patient Location: PACU  Anesthesia Type:Spinal  Level of Consciousness: awake, alert  and oriented  Airway & Oxygen Therapy: Patient Spontanous Breathing  Post-op Assessment: Report given to RN and Post -op Vital signs reviewed and stable  Post vital signs: Reviewed and stable  Last Vitals:  Vitals Value Taken Time  BP    Temp    Pulse 73 05/26/2018 12:59 PM  Resp 21 05/26/2018 12:59 PM  SpO2 100 % 05/26/2018 12:59 PM  Vitals shown include unvalidated device data.  Last Pain:  Vitals:   05/26/18 1018  PainSc: 9          Complications: No apparent anesthesia complications

## 2018-05-26 NOTE — Anesthesia Procedure Notes (Signed)
Spinal  Patient location during procedure: OR Start time: 05/26/2018 11:44 AM End time: 05/26/2018 11:48 AM Staffing Anesthesiologist: Leilani Able, MD Performed: anesthesiologist  Preanesthetic Checklist Completed: patient identified, site marked, surgical consent, pre-op evaluation, timeout performed, IV checked, risks and benefits discussed and monitors and equipment checked Spinal Block Patient position: sitting Prep: site prepped and draped and DuraPrep Patient monitoring: continuous pulse ox and blood pressure Approach: midline Location: L3-4 Injection technique: single-shot Needle Needle type: Pencan  Needle gauge: 24 G Needle length: 10 cm Needle insertion depth: 6 cm Assessment Sensory level: T4

## 2018-05-26 NOTE — H&P (Signed)
Brooke Gregory is a 28 y.o. female G3P2 at [redacted]w[redacted]d presenting in labor. Patient was seen yesterday in MAU with complaints of contractions and was found to be 1 cm. She returns today with regular contractions and was found to be 2-3 cm. Patient with previous 2 cesarean section with cervical change. Patient received prenatal care at [facility] which was complicated by .GDM and desire for permanent sterilization  OB History    Gravida  3   Para  2   Term  2   Preterm      AB      Living  2     SAB      TAB      Ectopic      Multiple      Live Births  2          Past Medical History:  Diagnosis Date  . Gestational diabetes    Past Surgical History:  Procedure Laterality Date  . CESAREAN SECTION     Family History: family history includes Diabetes in her mother; Hypertension in her mother; Kidney disease in her maternal grandmother. Social History:  reports that she has never smoked. She has never used smokeless tobacco. She reports that she does not drink alcohol or use drugs.     Maternal Diabetes: Yes:  Diabetes Type:  Diet controlled Genetic Screening: Normal Maternal Ultrasounds/Referrals: Normal Fetal Ultrasounds or other Referrals:  None Maternal Substance Abuse:  No Significant Maternal Medications:  None Significant Maternal Lab Results:  None Other Comments:  None  ROS  See pertinent in HPI History Dilation: 2.5 Effacement (%): 70 Exam by:: Marvel Plan RN  Blood pressure 135/83, pulse (!) 103, temperature 98 F (36.7 C), resp. rate 18, height 5\' 1"  (1.549 m), weight 103.4 kg, last menstrual period 09/23/2017. Exam Physical Exam  GENERAL: Well-developed, well-nourished female in no acute distress.  LUNGS: Clear to auscultation bilaterally.  HEART: Regular rate and rhythm. ABDOMEN: Soft, nontender, gravid PELVIC: not performed. EXTREMITIES: No cyanosis, clubbing, or edema, 2+ distal pulses.  Prenatal labs: ABO, Rh: O/Positive/-- (09/03  1159) Antibody: Negative (09/03 1159) Rubella: 1.98 (09/03 1159) RPR: Non Reactive (12/02 0903)  HBsAg: Negative (09/03 1159)  HIV: Non Reactive (12/02 0903)  GBS:   posiitve  Assessment/Plan: 28 yo G3P2 at [redacted]w[redacted]d in latent labor with 2 previous cesarean section and desire for permanent sterilization - Risks, benefits and alternatives were explained including but not limited to risks of bleeding, infection and damage to adjacent organs.  - Patient desires permanent sterilization. Risks and benefits of procedure discussed with patient including permanence of method, bleeding, infection, injury to surrounding organs and need for additional procedures. Risk failure of 0.5-1% with increased risk of ectopic gestation if pregnancy occurs was also discussed with patient. - Patient verbalized understanding and all questions were answered - Patient PPROM in MAU with fetal foot in the vagina. Anesthesia notified and patient to OR    Brooke Gregory 05/26/2018, 11:32 AM

## 2018-05-26 NOTE — MAU Note (Signed)
Pt present to MAU with co of ctx since yesterday around 4pm when she was dc from MAU.  Pt denied LOF initially, but grossly ruptured in MAU exam room.  Initial exam was 2.5 and 70% effaced.  Pt was being prepped for OR and complained to RN that she "felt something weird" in vagina.  RN rechecked pt and felt a foot presenting.  MD called and notified of change.  ROB nurse was called and was able to insert an IV.  Pt taken to OR1.  STAT section was not called per MD.

## 2018-05-26 NOTE — Op Note (Signed)
Brooke Gregory PROCEDURE DATE: 05/26/2018  PREOPERATIVE DIAGNOSIS: Intrauterine pregnancy at  [redacted]w[redacted]d weeks gestation; malpresentation: breech and previous uterine incision kerr x2 and desire for permanent sterilization  POSTOPERATIVE DIAGNOSIS: The same  PROCEDURE:     Cesarean Section and bilateral tubal ligation using Pomeroy method   SURGEON:  Dr. Gigi Gin Suly Vukelich  ASSISTANT: none  INDICATIONS: Brooke Gregory is a 28 y.o. G3P3003 at [redacted]w[redacted]d scheduled for cesarean section secondary to malpresentation: breech and previous uterine incision kerr x2.  The risks of cesarean section discussed with the patient included but were not limited to: bleeding which may require transfusion or reoperation; infection which may require antibiotics; injury to bowel, bladder, ureters or other surrounding organs; injury to the fetus; need for additional procedures including hysterectomy in the event of a life-threatening hemorrhage; placental abnormalities wth subsequent pregnancies, incisional problems, thromboembolic phenomenon and other postoperative/anesthesia complications. She also desires permanent sterilization. Risks and benefits of procedure discussed with patient including permanence of method, bleeding, infection, injury to surrounding organs and need for additional procedures. Risk failure of 0.5-1% with increased risk of ectopic gestation if pregnancy occurs was also discussed with patient. The patient concurred with the proposed plan, giving informed written consent for the procedure.    FINDINGS:  Viable female infant in breech presentation.  Apgars 2 and 9.  Clear amniotic fluid.  Intact placenta, three vessel cord.  Normal uterus, fallopian tubes and ovaries bilaterally.  ANESTHESIA:    Spinal INTRAVENOUS FLUIDS:2100 ml ESTIMATED BLOOD LOSS: 514 mL ml URINE OUTPUT:  100 ml SPECIMENS: Placenta sent to L&D and portions of right and left fallopian tubes sent to pathology COMPLICATIONS: None  immediate  PROCEDURE IN DETAIL:  The patient received intravenous antibiotics and had sequential compression devices applied to her lower extremities while in the preoperative area.  She was then taken to the operating room where anesthesia was induced and was found to be adequate. A foley catheter was placed into her bladder and attached to Loreda Silverio gravity. She was then placed in a dorsal supine position with a leftward tilt, and prepped and draped in a sterile manner. After an adequate timeout was performed, a Pfannenstiel skin incision was made with scalpel and carried through to the underlying layer of fascia. The fascia was incised in the midline and this incision was extended bilaterally using the Mayo scissors. Kocher clamps were applied to the superior aspect of the fascial incision and the underlying rectus muscles were dissected off bluntly. A similar process was carried out on the inferior aspect of the facial incision. The rectus muscles were separated in the midline bluntly and the peritoneum was entered bluntly. The Alexis self-retaining retractor was introduced into the abdominal cavity. Attention was turned to the lower uterine segment where a bladder flap was created, and a transverse hysterotomy was made with a scalpel and extended bilaterally bluntly. The infant was successfully delivered. The cord was clamped and cut and infant was handed over to awaiting neonatology team. Uterine massage was then administered and the placenta delivered intact with three-vessel cord. The uterus was cleared of clot and debris.  The hysterotomy was closed with 0 Vicryl in a running locked fashion, and an imbricating layer was also placed with a 0 Vicryl. Overall, excellent hemostasis was noted. The pelvis copiously irrigated and cleared of all clot and debris. The patient's left fallopian tube was then identified, brought to the incision, and grasped with a Babcock clamp. The tube was then followed out to the  fimbria.  The Babcock clamp was then used to grasp the tube approximately 4 cm from the cornual region. A 3 cm segment of the tube was then ligated with free tie of plain gut suture, transected and excised. Good hemostasis was noted and the tube was returned to the abdomen. The right fallopian tube was then identified to its fimbriated end, ligated, and a 3 cm segment excised in a similar fashion. Excellent hemostasis was noted, and the tube returned to the abdomen. Hemostasis was confirmed on all surfaces.  The peritoneum and the muscles were reapproximated using 0 vicryl interrupted stitches. The fascia was then closed using 0 Vicryl in a running fashion.  The subcutaneous layer was reapproximated with plain gut and the skin was closed in a subcuticular fashion using 3.0 Vicryl. The patient tolerated the procedure well. Sponge, lap, instrument and needle counts were correct x 2. She was taken to the recovery room in stable condition.    Miroslava Santellan ConstantMD  05/26/2018 1:15 PM

## 2018-05-27 LAB — CBC
HCT: 26.1 % — ABNORMAL LOW (ref 36.0–46.0)
Hemoglobin: 8.4 g/dL — ABNORMAL LOW (ref 12.0–15.0)
MCH: 25.9 pg — ABNORMAL LOW (ref 26.0–34.0)
MCHC: 32.2 g/dL (ref 30.0–36.0)
MCV: 80.6 fL (ref 80.0–100.0)
Platelets: 337 10*3/uL (ref 150–400)
RBC: 3.24 MIL/uL — ABNORMAL LOW (ref 3.87–5.11)
RDW: 15.9 % — AB (ref 11.5–15.5)
WBC: 14.1 10*3/uL — ABNORMAL HIGH (ref 4.0–10.5)
nRBC: 0 % (ref 0.0–0.2)

## 2018-05-27 LAB — RPR: RPR Ser Ql: NONREACTIVE

## 2018-05-27 LAB — BIRTH TISSUE RECOVERY COLLECTION (PLACENTA DONATION)

## 2018-05-27 NOTE — Progress Notes (Signed)
Post Op Day 1 Subjective: tolerating PO, + flatus and foley removed this morning. Not out of bed yet.   Objective: Blood pressure (!) 98/56, pulse 76, temperature (!) 97.4 F (36.3 C), temperature source Oral, resp. rate 16, height 5\' 1"  (1.549 m), weight 103.4 kg, last menstrual period 09/23/2017, SpO2 100 %, unknown if currently breastfeeding.  Physical Exam:  General: alert, cooperative, appears stated age and no distress Lochia: appropriate Uterine Fundus: firm Incision: healing well, no significant drainage, no dehiscence, no significant erythema DVT Evaluation: No evidence of DVT seen on physical exam.  Recent Labs    05/26/18 1324  HGB 10.2*  HCT 32.6*    Assessment/Plan: Plan for discharge tomorrow, Breastfeeding and Lactation consult   LOS: 1 day   TRUE Garciamartinez 05/27/2018, 9:05 AM

## 2018-05-27 NOTE — Progress Notes (Signed)
Pt denies symptoms of dizziness or lightheadedness

## 2018-05-28 LAB — URINE CULTURE

## 2018-05-28 LAB — URINE CULTURE, OB REFLEX

## 2018-05-28 LAB — GLUCOSE, CAPILLARY: Glucose-Capillary: 103 mg/dL — ABNORMAL HIGH (ref 70–99)

## 2018-05-28 LAB — CULTURE, OB URINE

## 2018-05-28 NOTE — Progress Notes (Signed)
Post Partum/Op Day 2 for rLTCS/BTL  Subjective: no complaints, up ad lib, voiding, tolerating PO and + flatus. She reports feeling well this morning and is doing great with baby! She would like to stay for one more day.  Objective: Blood pressure 110/72, pulse 96, temperature 98.1 F (36.7 C), temperature source Oral, resp. rate 16, height 5\' 1"  (1.549 m), weight 103.4 kg, last menstrual period 09/23/2017, SpO2 100 %, unknown if currently breastfeeding.  Physical Exam:  General: alert, cooperative and no distress Lochia: appropriate Uterine Fundus: firm Incision: healing well, no significant drainage DVT Evaluation: No evidence of DVT seen on physical exam. No significant calf/ankle edema.  Recent Labs    05/26/18 1324 05/27/18 0912  HGB 10.2* 8.4*  HCT 32.6* 26.1*    Assessment/Plan: Plan for discharge tomorrow, Breastfeeding and Contraception BTL (done)   LOS: 2 days   Dollene Cleveland 05/28/2018, 11:04 AM

## 2018-05-28 NOTE — Addendum Note (Signed)
Addendum  created 05/28/18 1572 by Junious Silk, CRNA   Clinical Note Signed

## 2018-05-28 NOTE — Lactation Note (Signed)
This note was copied from a baby's chart. Lactation Consultation Note  Patient Name: Brooke Gregory GYKZL'D Date: 05/28/2018 Reason for consult: Follow-up assessment   P3, Baby 47 hours old.  7.5% weight loss.  5 voids/5 stools in the last 24 hours. Stools transitioning. Observed baby breastfeeding with intermittent swallows. Mother states baby is cluster feeding. After feeding noted baby has tight anterior lingual frenulum. Suggest mother discuss with Ped MD. Feed on demand approximately 8-12 times per day.   Suggest mother call if she need further assistance.     Maternal Data    Feeding Feeding Type: Breast Fed  LATCH Score Latch: Grasps breast easily, tongue down, lips flanged, rhythmical sucking.  Audible Swallowing: A few with stimulation  Type of Nipple: Everted at rest and after stimulation  Comfort (Breast/Nipple): Soft / non-tender  Hold (Positioning): Assistance needed to correctly position infant at breast and maintain latch.  LATCH Score: 8  Interventions Interventions: Assisted with latch;Breast feeding basics reviewed;Skin to skin;Breast massage;Hand express;Adjust position;Support pillows  Lactation Tools Discussed/Used     Consult Status Consult Status: Follow-up Date: 05/29/18 Follow-up type: In-patient    [name] [facility] 05/28/2018, 11:46 AM

## 2018-05-28 NOTE — Progress Notes (Signed)
Called Dr Dareen Piano to discuss honeycomb dressing.  Honeycomb dressing pulling away from skin and totally soaked with fluid.  Order given to change dressing

## 2018-05-28 NOTE — Anesthesia Postprocedure Evaluation (Signed)
Anesthesia Post Note  Patient: Brooke Gregory  Procedure(s) Performed: CESAREAN SECTION WITH BILATERAL TUBAL LIGATION (Bilateral )     Patient location during evaluation: Mother Baby Anesthesia Type: Spinal Level of consciousness: oriented and awake and alert Pain management: pain level controlled Vital Signs Assessment: post-procedure vital signs reviewed and stable Respiratory status: spontaneous breathing and respiratory function stable Cardiovascular status: blood pressure returned to baseline and stable Postop Assessment: no headache, no backache and no apparent nausea or vomiting Anesthetic complications: no    Last Vitals:  Vitals:   05/27/18 2141 05/28/18 0503  BP: 120/71 110/72  Pulse: 90 96  Resp: 16 16  Temp: 36.7 C 36.7 C  SpO2: 100%     Last Pain:  Vitals:   05/28/18 0503  TempSrc: Oral  PainSc:    Pain Goal:                   Junious Silk

## 2018-05-29 ENCOUNTER — Encounter: Payer: Medicaid Other | Admitting: Family Medicine

## 2018-05-29 MED ORDER — IBUPROFEN 800 MG PO TABS: 800.0000 mg | ORAL_TABLET | Freq: Three times a day (TID) | ORAL | 0 refills | Status: DC | PRN

## 2018-05-29 MED ORDER — OXYCODONE HCL 5 MG PO TABS: 5.0000 mg | ORAL_TABLET | Freq: Four times a day (QID) | ORAL | 0 refills | Status: AC | PRN

## 2018-05-29 MED ORDER — SENNOSIDES-DOCUSATE SODIUM 8.6-50 MG PO TABS: 2.0000 | ORAL_TABLET | ORAL | 0 refills | Status: DC

## 2018-05-29 NOTE — Lactation Note (Signed)
This note was copied from a baby's chart. Lactation Consultation Note:  Infant is 40 hours old. She is at 8 % weight loss.   Mother reports that she has nipple soreness and around areola. No observed cracking . Mother has comfort gels. She was encouraged to hand express milk and apply to nipples.   Assist mother with off sided latch technique.  Infant latched on well in football hold.  Observed good depth. FOB shown how to assist in flanging infants lips for wide gape.  Mother advised in breast compression. Mothers breast are filling.   Encouraged mother to rotate positions frequently.   Advised mother to breastfeed infant on cue and at least 8-12 times in 24 hours. Discussed cluster feeding.  Discouraged the use of a pacifier.  Discussed treatment and prevention of engorgement.  Mother was given a harmony hand pump and advised in use.   She is not active with WIC . She plans to purchase a DEBP before going back to work. Mother is aware of all available LC services at [facility] and community support.  Patient Name: Brooke Gregory FVCBS'W Date: 05/29/2018 Reason for consult: Follow-up assessment   Maternal Data    Feeding Feeding Type: Breast Fed  LATCH Score Latch: Grasps breast easily, tongue down, lips flanged, rhythmical sucking.  Audible Swallowing: Spontaneous and intermittent  Type of Nipple: Everted at rest and after stimulation  Comfort (Breast/Nipple): Filling, red/small blisters or bruises, mild/mod discomfort(soreness around nipple and areola)  Hold (Positioning): Assistance needed to correctly position infant at breast and maintain latch.  LATCH Score: 8  Interventions Interventions: Assisted with latch;Skin to skin;Breast massage;Hand express;Breast compression;Adjust position;Support pillows;Position options;Expressed milk;Comfort gels;Hand pump  Lactation Tools Discussed/Used     Consult Status Consult Status: Follow-up Date: 05/29/18 Follow-up  type: In-patient    Stevan Born [facility] 05/29/2018, 10:37 AM

## 2018-05-29 NOTE — Discharge Summary (Addendum)
OB Discharge Summary     Patient Name: Brooke Gregory DOB: 01/30/1991 MRN: [id number]  Date of admission: 05/26/2018 Delivering MD: Catalina Antigua   Date of discharge: 05/29/2018  Admitting diagnosis: CTX Intrauterine pregnancy: [redacted]w[redacted]d     Secondary diagnosis:  Active Problems:   S/P cesarean section   Maternal care for scar from previous cesarean delivery A1GDM, well controlled  Additional problems: None     Discharge diagnosis: Term Pregnancy Delivered                                                                                                Post partum procedures:postpartum tubal ligation  Augmentation: None, patient was c-sectioned  Complications: No procedural complications  Hospital course:  Sceduled C/S   28 y.o. yo G3P3003 at [redacted]w[redacted]d was admitted to the hospital 05/26/2018 for cesarean section with the following indication:Malpresentation PPROM in MAU with fetal foot in the vagina.  Membrane Rupture Time/Date: 11:30 AM ,05/26/2018   Patient delivered a Viable infant.05/26/2018  Details of operation can be found in separate operative note.  Pateint had an uncomplicated postpartum course.  She is ambulating, tolerating a regular diet, passing flatus, and urinating well. Patient is discharged home in stable condition on  05/29/18        Physical exam  Vitals:   05/28/18 0503 05/28/18 1530 05/28/18 2146 05/29/18 0548  BP: 110/72 122/84 124/85 125/68  Pulse: 96 (!) 108 88 91  Resp: 16 16  18   Temp: 98.1 F (36.7 C) 98.3 F (36.8 C) 97.9 F (36.6 C) 97.6 F (36.4 C)  TempSrc: Oral Oral Oral Tympanic  SpO2:   98%   Weight:      Height:       General: alert, cooperative and no distress Lochia: appropriate Uterine Fundus: firm Incision: Healing well with no significant drainage, No significant erythema DVT Evaluation: No evidence of DVT seen on physical exam. No significant calf/ankle edema. Labs: Lab Results  Component Value Date   WBC 14.1 (H) 05/27/2018    HGB 8.4 (L) 05/27/2018   HCT 26.1 (L) 05/27/2018   MCV 80.6 05/27/2018   PLT 337 05/27/2018   CMP Latest Ref Rng & Units 05/26/2018  Creatinine 0.44 - 1.00 mg/dL 1.63    Discharge instruction: per After Visit Summary and "Baby and Me Booklet".  After visit meds:  Allergies as of 05/29/2018   No Known Allergies     Medication List    STOP taking these medications   ACCU-CHEK FASTCLIX LANCETS Misc   glucose blood test strip Commonly known as:  ACCU-CHEK GUIDE     TAKE these medications   COMFORT FIT MATERNITY SUPP MED Misc 1 Device by Does not apply route daily.   ferrous sulfate 325 (65 FE) MG tablet Take 1 tablet (325 mg total) by mouth daily with breakfast.   ibuprofen 800 MG tablet Commonly known as:  ADVIL,MOTRIN Take 1 tablet (800 mg total) by mouth every 8 (eight) hours as needed.   multivitamin-prenatal 27-0.8 MG Tabs tablet Take 1 tablet by mouth daily at 12 noon.   oxyCODONE 5  MG immediate release tablet Commonly known as:  Oxy IR/ROXICODONE Take 1 tablet (5 mg total) by mouth every 6 (six) hours as needed for up to 3 days for moderate pain.   senna-docusate 8.6-50 MG tablet Commonly known as:  Senokot-S Take 2 tablets by mouth daily. Start taking on:  May 30, 2018      Diet: carb modified diet  Activity: Advance as tolerated. Pelvic rest for 6 weeks.   Outpatient follow up:4 weeks Follow up Appt: Future Appointments  Date Time Provider Department Center  05/31/2018  1:30 PM [facility] US 1 [facility]US M[facility]-US  06/11/2018  1:30 PM [facility] NURSE [facility] WOC  06/26/2018  9:35 AM Allie Bossierove, Myra C, MD [facility] WOC   Follow up Visit:No follow-ups on file.  Postpartum contraception: Tubal Ligation  Newborn Data: Live born female  Birth Weight: 8 lb 11 oz (3941 g) APGAR: 2, 9  Newborn Delivery   Birth date/time:  05/26/2018 12:07:00 Delivery type:  C-Section, Low Transverse Trial of labor:  No C-section categorization:  Repeat    Baby Feeding:  Breast Disposition:home with mother  05/29/2018 Dollene ClevelandHannah C Anderson, DO  OB FELLOW DISCHARGE ATTESTATION  I have seen and examined this patient and agree with above documentation in the resident's note.   Gwenevere AbbotNimeka Jola Critzer, MD  OB Fellow  05/29/2018, 10:09 AM

## 2018-05-30 ENCOUNTER — Encounter (HOSPITAL_COMMUNITY): Payer: Self-pay

## 2018-05-30 ENCOUNTER — Encounter (HOSPITAL_COMMUNITY)
Admission: RE | Admit: 2018-05-30 | Discharge: 2018-05-30 | Disposition: A | Discharge status: Home / Self Care | Payer: Medicaid Other | Source: Ambulatory Visit

## 2018-05-30 HISTORY — DX: Gestational diabetes mellitus in pregnancy, unspecified control: O24.419

## 2018-05-31 ENCOUNTER — Encounter (HOSPITAL_COMMUNITY): Payer: Self-pay

## 2018-05-31 ENCOUNTER — Ambulatory Visit (HOSPITAL_COMMUNITY): Admission: RE | Admit: 2018-05-31 | Payer: Medicaid Other | Source: Ambulatory Visit

## 2018-06-05 ENCOUNTER — Encounter: Payer: Medicaid Other | Admitting: Obstetrics and Gynecology

## 2018-06-11 ENCOUNTER — Ambulatory Visit (INDEPENDENT_AMBULATORY_CARE_PROVIDER_SITE_OTHER): Payer: Medicaid Other | Admitting: General Practice

## 2018-06-11 VITALS — BP 139/85 | HR 86 | Ht 61.0 in | Wt 209.0 lb

## 2018-06-11 DIAGNOSIS — Z5189 Encounter for other specified aftercare: Secondary | ICD-10-CM

## 2018-06-11 NOTE — Progress Notes (Signed)
Patient presents to office today for wound check following repeat c-section on 2/1. Patient reports doing well since then but has felt pretty stressed and has been tearful. Patient reports one-two mild headaches but denies dizziness or blurry vision. Discussed elevated BP with Dr Marice Potter who recommends following up in 1 week for repeat BP check. Precautions reviewed with patient. Incision is clean, dry & intact. Reviewed wound care and signs & symptoms of infection with patient. Also recommended she make a follow up appt with Asher Muir. Patient verbalized understanding & had no questions.  Chase Caller RN BSN 06/11/18

## 2018-06-12 ENCOUNTER — Encounter: Payer: Medicaid Other | Admitting: Obstetrics & Gynecology

## 2018-06-15 NOTE — Progress Notes (Signed)
I have reviewed this chart and agree with the RN/CMA assessment and management.    Carriann Hesse C Vendela Troung, MD, FACOG Attending Physician, [facility]  

## 2018-06-19 ENCOUNTER — Ambulatory Visit: Payer: Medicaid Other

## 2018-06-19 VITALS — BP 158/100 | HR 89 | Wt 206.1 lb

## 2018-06-19 DIAGNOSIS — Z0130 Encounter for examination of blood pressure without abnormal findings: Secondary | ICD-10-CM

## 2018-06-19 NOTE — Progress Notes (Addendum)
Pt here today for BP check s/p wound check visit on 06/11/18 which pt with elevated BP.   BP today is 141/96 and manually in LA 158/100.  Pt denies any headaches, blurred vision, or swelling.  Notified Debbrah Alar, NP who recommended that pt come back in 48 hrs for rpt BP check.  Notified pt of provider's recommendation and to make sure that monitors for signs of elevated BP and if she gets them before her appt in two day to please go to [facility] ED for evaluation.  Pt stated understanding with no further questions.   Reviewed the documentation and agree with the plan of care.  Nolene Bernheim, RN, MSN, NP-BC Nurse Practitioner, [facility] for [employer], [facility] 06/20/2018 4:32 PM

## 2018-06-20 ENCOUNTER — Ambulatory Visit: Payer: Medicaid Other

## 2018-06-26 ENCOUNTER — Ambulatory Visit (INDEPENDENT_AMBULATORY_CARE_PROVIDER_SITE_OTHER): Payer: Medicaid Other | Admitting: Obstetrics & Gynecology

## 2018-06-26 ENCOUNTER — Telehealth: Payer: Self-pay

## 2018-06-26 DIAGNOSIS — Z1389 Encounter for screening for other disorder: Secondary | ICD-10-CM

## 2018-06-26 DIAGNOSIS — Z6838 Body mass index (BMI) 38.0-38.9, adult: Secondary | ICD-10-CM

## 2018-06-26 DIAGNOSIS — O24429 Gestational diabetes mellitus in childbirth, unspecified control: Secondary | ICD-10-CM

## 2018-06-26 MED ORDER — BREAST PUMP MISC: 0 refills | Status: DC

## 2018-06-26 NOTE — Progress Notes (Signed)
Subjective:     Brooke Gregory is a 28 y.o. female who presents for a postpartum visit. She is 4 weeks postpartum following a low cervical transverse Cesarean section. I have fully reviewed the prenatal and intrapartum course. The delivery was at 38 1/2  gestational weeks. Outcome: repeat cesarean section, low transverse incision. Anesthesia: spinal. Postpartum course has been normal. Baby's course has been normal. Baby is feeding by both breast and bottle - Similac Advance. Bleeding thin lochia. Bowel function is normal. Bladder function is normal. Patient is not sexually active. Contraception method is tubal ligation. Postpartum depression screening: negative.  The following portions of the patient's history were reviewed and updated as appropriate: allergies, current medications, past family history, past medical history, past social history, past surgical history and problem list.  Review of Systems Pertinent items are noted in HPI.   Objective:   General:  alert   Breasts:  inspection negative, no nipple discharge or bleeding, no masses or nodularity palpable  Lungs: clear to auscultation bilaterally  Heart:  regular rate and rhythm, S1, S2 normal, no murmur, click, rub or gallop  Abdomen: soft, non-tender; bowel sounds normal; no masses,  no organomegaly, incision- healed well   Vulva:  not evaluated  Vagina: not evaluated  Cervix:  not evaluated  Corpus: not examined  Adnexa:  not evaluated  Rectal Exam: Not performed.        Assessment:   Normal postpartum exam. Pap smear not done at today's visit.   Plan:    1. Contraception: tubal ligation  2. GDM- she will schedule a 2 hour GTT in the next 2 weeks - we discussed the 50% likelihood that she will develop DM in her lifetime 3. Pap smear due in 2023

## 2018-06-26 NOTE — Telephone Encounter (Signed)
Pt's breast pump order printed out.  I called and informed pt that she can go to [email or web address] and fill out the information it requests.  I explained to the pt that they will send information to our office and then we will will fax it back.  I advised pt that if she has any questions to please give the office a call.  Pt stated understanding with no further questions.

## 2018-07-31 ENCOUNTER — Telehealth: Payer: Self-pay | Admitting: Licensed Clinical Social Worker

## 2018-07-31 NOTE — Telephone Encounter (Signed)
Care Coordination  Telephone Outreach Note 07/31/2018 Name: Brooke Gregory MRN: [id number] DOB: 07/14/1990  Referred by: Dr. Homero Gregory, during patient's 2 month's [facility] . Patient scored 11 on the Edinburgh depression scale.  LCSW called Ms. Brooke Gregory reference assessing for the above referral.   Confirmed patient's address: Yes    Confirmed patient's date of birth  Yes   Patient has not spoken to her medical provider about possible concerns of postpartum. Patient has support system and is looking forward to returning back to work.    ASSESSMENT: Patient is experiencing possible symptoms of postpartum. Reports having symptom with her previous child and did well with managing.   Intervention:Client interviewed and appropriate assessments performed. Discussed support system, and community resource options. Provided client with information about ongoing therapy resources and encouraged her to speak with her OBGYN about her symptoms.  Plan:  Patient in agreement to review resources provided and contact OBGYN   Dr. Homero Gregory referring provider has been notified of this outreach and Ms. Brooke Gregory's plan.   Brooke Hines, LCSW [facility]   [phone] 2:53 PM

## 2018-08-14 ENCOUNTER — Encounter: Payer: Self-pay | Admitting: Family Medicine

## 2018-09-15 ENCOUNTER — Other Ambulatory Visit: Payer: Self-pay

## 2018-09-15 ENCOUNTER — Encounter (HOSPITAL_COMMUNITY): Payer: Self-pay | Admitting: Emergency Medicine

## 2018-09-15 ENCOUNTER — Emergency Department (HOSPITAL_COMMUNITY)
Admission: EM | Admit: 2018-09-15 | Discharge: 2018-09-15 | Disposition: A | Discharge status: Home / Self Care | Payer: Medicaid Other | Attending: Emergency Medicine | Admitting: Emergency Medicine

## 2018-09-15 DIAGNOSIS — Z79899 Other long term (current) drug therapy: Secondary | ICD-10-CM | POA: Diagnosis not present

## 2018-09-15 DIAGNOSIS — K029 Dental caries, unspecified: Secondary | ICD-10-CM | POA: Insufficient documentation

## 2018-09-15 DIAGNOSIS — K0889 Other specified disorders of teeth and supporting structures: Secondary | ICD-10-CM | POA: Diagnosis present

## 2018-09-15 DIAGNOSIS — K047 Periapical abscess without sinus: Secondary | ICD-10-CM

## 2018-09-15 MED ORDER — PENICILLIN V POTASSIUM 250 MG PO TABS
500.0000 mg | ORAL_TABLET | Freq: Once | ORAL | Status: AC
  Administered 2018-09-15: 06:00:00 500 mg via ORAL
  Filled 2018-09-15: qty 2

## 2018-09-15 MED ORDER — PENICILLIN V POTASSIUM 500 MG PO TABS: 500.0000 mg | ORAL_TABLET | Freq: Four times a day (QID) | ORAL | 0 refills | Status: AC

## 2018-09-15 MED ORDER — ACETAMINOPHEN 500 MG PO TABS
1000.0000 mg | ORAL_TABLET | Freq: Once | ORAL | Status: AC
  Administered 2018-09-15: 06:00:00 1000 mg via ORAL
  Filled 2018-09-15: qty 2

## 2018-09-15 NOTE — ED Triage Notes (Signed)
C/o R upper dental pain/abscess x 24 hours.  Pt breastfeeds.

## 2018-09-15 NOTE — ED Provider Notes (Signed)
[facility] EMERGENCY DEPARTMENT Provider Note   CSN: [id number] Arrival date & time: 09/15/18  0330    History   Chief Complaint Chief Complaint  Patient presents with  . Dental Pain    HPI Brooke Gregory is a 28 y.o. female.      Dental Pain  Location:  Lower Lower teeth location:  32/RL 3rd molar Quality:  Aching and sharp Severity:  Mild Onset quality:  Gradual Timing:  Constant Progression:  Worsening Chronicity:  New Relieved by:  Acetaminophen Worsened by:  Nothing Ineffective treatments:  None tried   Past Medical History:  Diagnosis Date  . Gestational diabetes     Patient Active Problem List   Diagnosis Date Noted  . Gestational diabetes 04/10/2018    Past Surgical History:  Procedure Laterality Date  . CESAREAN SECTION    . CESAREAN SECTION WITH BILATERAL TUBAL LIGATION Bilateral 05/26/2018   Procedure: CESAREAN SECTION WITH BILATERAL TUBAL LIGATION;  Surgeon: Catalina Antigua, MD;  Location: [facility];  Service: Obstetrics;  Laterality: Bilateral;     OB History    Gravida  3   Para  3   Term  3   Preterm      AB      Living  3     SAB      TAB      Ectopic      Multiple  0   Live Births  3            Home Medications    Prior to Admission medications   Medication Sig Start Date End Date Taking? Authorizing Provider  acetaminophen (TYLENOL) 325 MG tablet Take 650 mg by mouth every 6 (six) hours as needed for mild pain.   Yes [provider]  ferrous sulfate 325 (65 FE) MG tablet Take 1 tablet (325 mg total) by mouth daily with breakfast. 03/06/18  Yes Kooistra, Charlesetta Garibaldi, CNM  Prenatal Vit-Fe Fumarate-FA (MULTIVITAMIN-PRENATAL) 27-0.8 MG TABS tablet Take 1 tablet by mouth daily at 12 noon.   Yes [provider]  penicillin v potassium (VEETID) 500 MG tablet Take 1 tablet (500 mg total) by mouth 4 (four) times daily for 7 days. 09/15/18 09/22/18  Nyles Mitton, Barbara Cower, MD     Family History Family History  Problem Relation Age of Onset  . Hypertension Mother   . Diabetes Mother   . Kidney disease Maternal Grandmother     Social History Social History   Tobacco Use  . Smoking status: Never Smoker  . Smokeless tobacco: Never Used  Substance Use Topics  . Alcohol use: Never    Frequency: Never  . Drug use: Never     Allergies   Patient has no known allergies.   Review of Systems Review of Systems  All other systems reviewed and are negative.    Physical Exam Updated Vital Signs BP (!) 160/100   Pulse 73   Temp 98.3 F (36.8 C) (Oral)   Resp 17   Ht 5\' 1"  (1.549 m)   Wt 90.7 kg   SpO2 99%   Breastfeeding Yes   BMI 37.79 kg/m   Physical Exam Vitals signs and nursing note reviewed.  HENT:     Head: Normocephalic and atraumatic.     Nose: Nose normal.     Mouth/Throat:     Tonsils: No tonsillar abscesses.   Eyes:     Extraocular Movements: Extraocular movements intact.     Conjunctiva/sclera: Conjunctivae normal.  Cardiovascular:     Rate and Rhythm: Normal rate.      ED Treatments / Results  Labs (all labs ordered are listed, but only abnormal results are displayed) Labs Reviewed - No data to display  EKG None  Radiology No results found.  Procedures Procedures (including critical care time)  Medications Ordered in ED Medications  acetaminophen (TYLENOL) tablet 1,000 mg (1,000 mg Oral Given 09/15/18 0607)  penicillin v potassium (VEETID) tablet 500 mg (500 mg Oral Given 09/15/18 0607)     Initial Impression / Assessment and Plan / ED Course  I have reviewed the triage vital signs and the nursing notes.  Pertinent labs & imaging results that were available during my care of the patient were reviewed by me and considered in my medical decision making (see chart for details).   dental caries with questionable infection? No e/o complication.  Abx, tylenol, dnetal follow up .   Final Clinical Impressions(s) /  ED Diagnoses   Final diagnoses:  Dental infection    ED Discharge Orders         Ordered    penicillin v potassium (VEETID) 500 MG tablet  4 times daily     09/15/18 0553           Wilberth Damon, Barbara CowerJason, MD 09/15/18 [phone]

## 2018-12-20 ENCOUNTER — Other Ambulatory Visit: Payer: Self-pay

## 2018-12-20 DIAGNOSIS — Z20822 Contact with and (suspected) exposure to covid-19: Secondary | ICD-10-CM

## 2018-12-22 LAB — NOVEL CORONAVIRUS, NAA: SARS-CoV-2, NAA: NOT DETECTED

## 2019-02-17 ENCOUNTER — Other Ambulatory Visit: Payer: Self-pay | Admitting: Family Medicine

## 2020-01-07 ENCOUNTER — Ambulatory Visit: Payer: Self-pay

## 2020-04-20 ENCOUNTER — Telehealth: Payer: Self-pay | Admitting: *Deleted

## 2020-04-20 NOTE — Telephone Encounter (Signed)
Returned call from 9:13 AM. Patient wanted to schedule an appointment, address is [place]. Voicemail full.

## 2020-05-06 IMAGING — US US MFM OB FOLLOW-UP
1 series · 13 of 28 positions shown · non-contrast
Comparison: none

[Series 1: us mfm ob follow-up · 52 acquisitions, 13 frames shown]
[im 2/52]
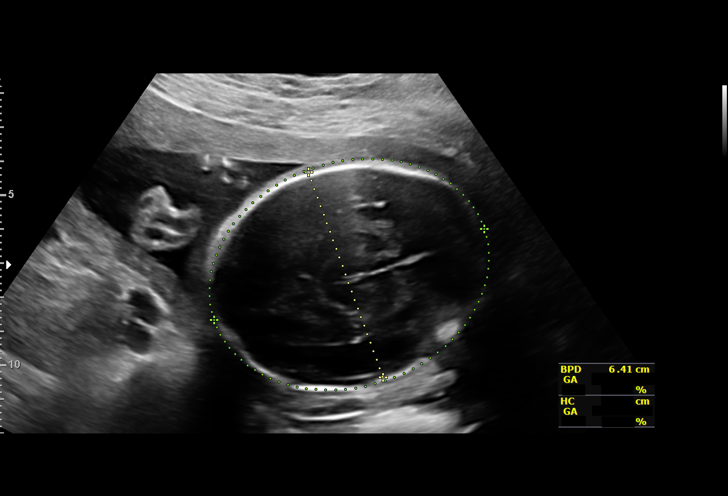
[im 6/52]
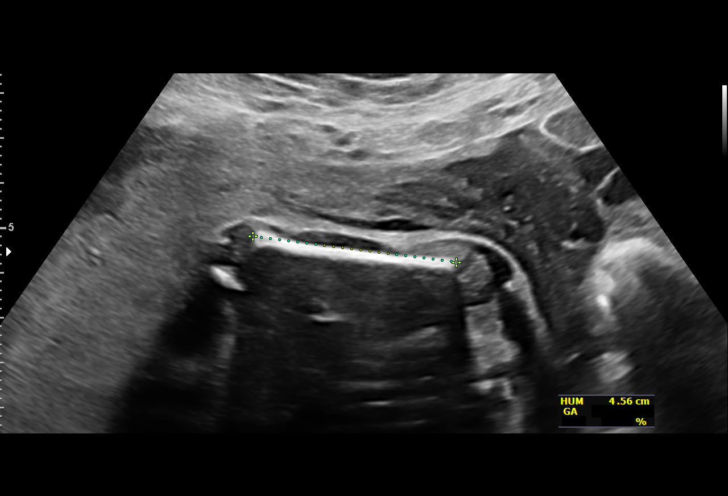
[im 10/52]
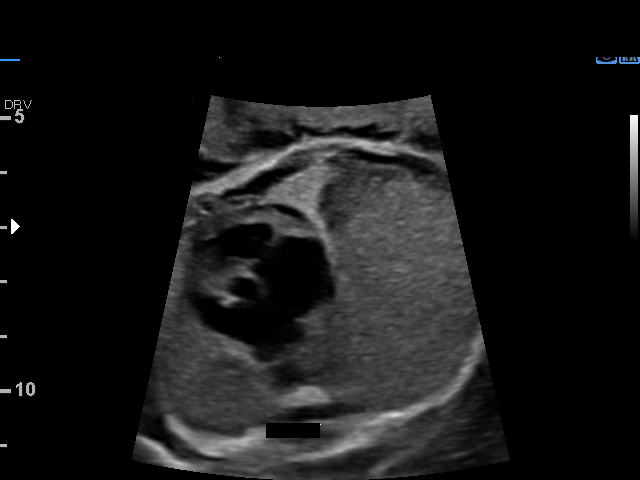
[im 14/52]
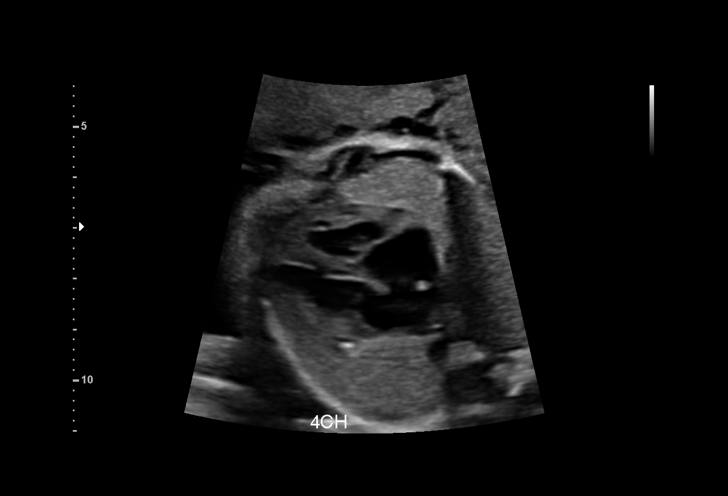
[im 18/52]
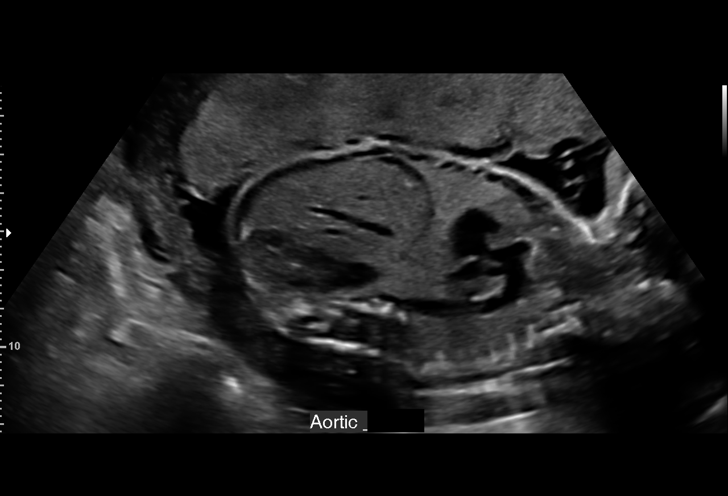
[im 21/52]
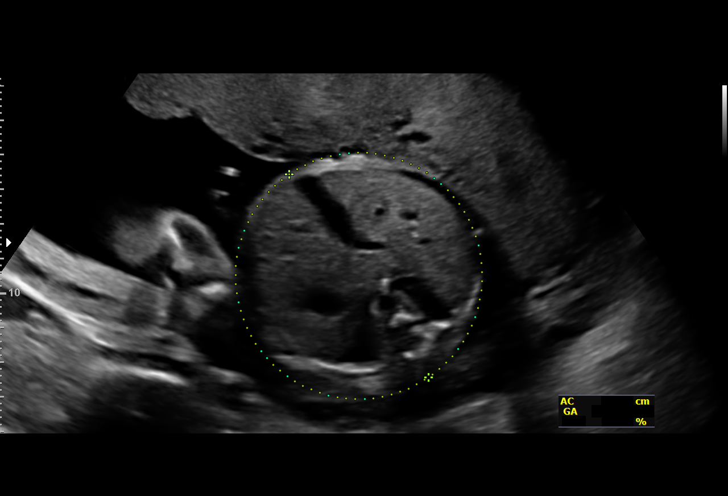
[im 27/52]
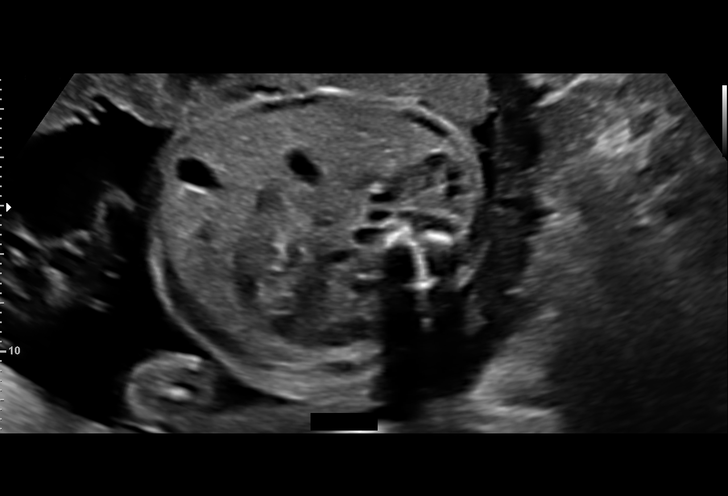
[im 31/52]
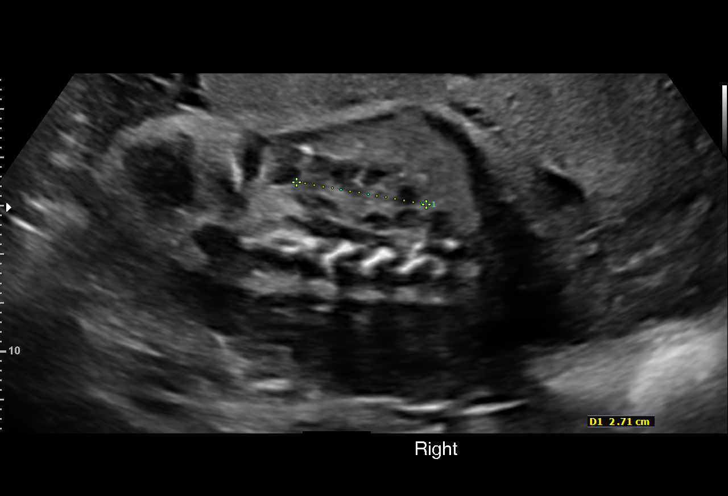
[im 35/52]
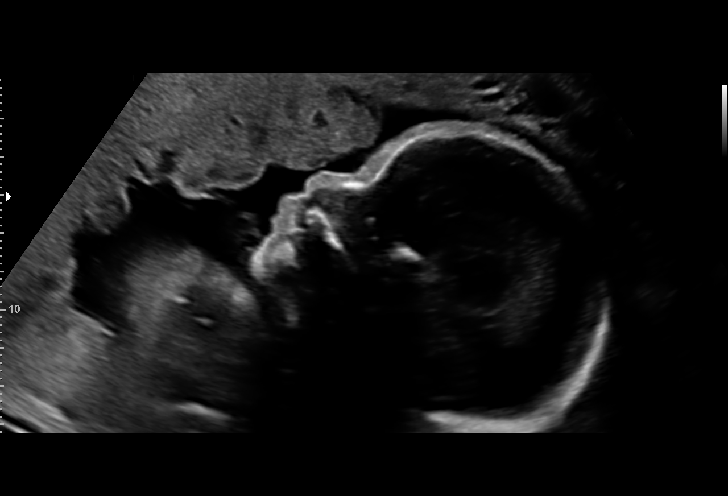
[im 38/52]
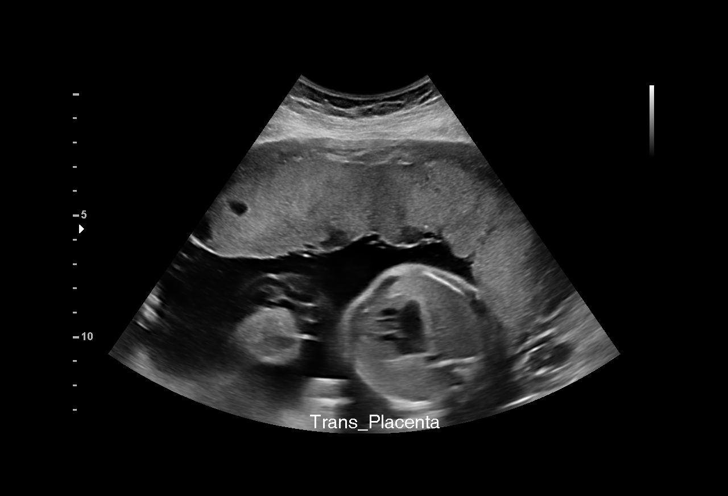
[im 42/52]
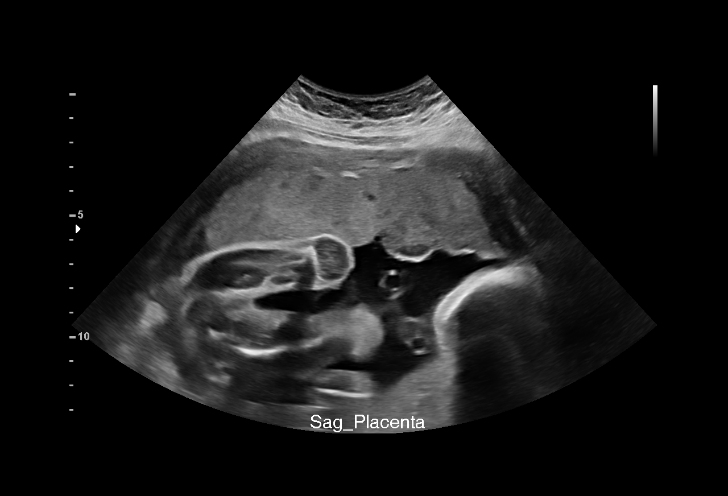
[im 46/52]
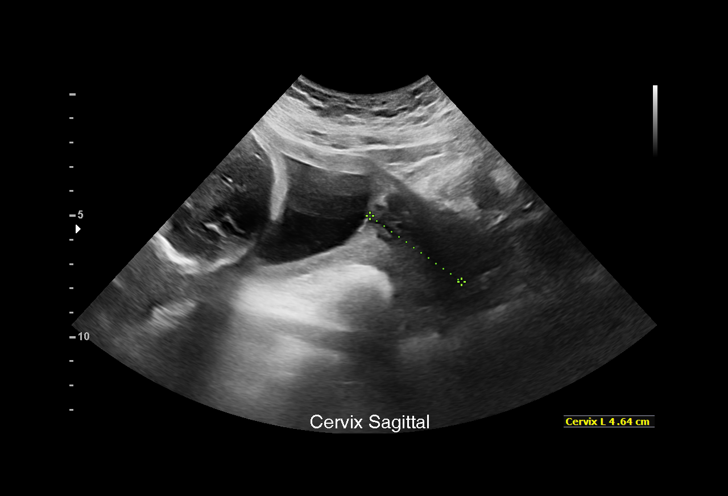
[im 50/52]
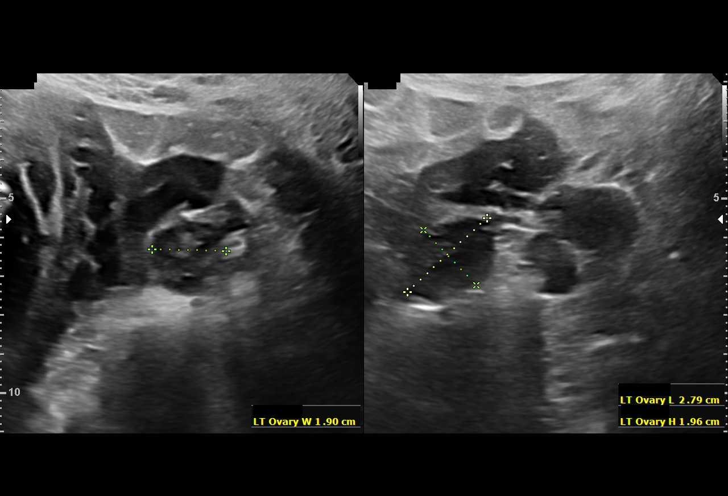

[13 of 28 positions shown; findings below may reference images not displayed]

[REDACTED]. [HOSPITAL],
                   [employer] CNM

 ----------------------------------------------------------------------

 ----------------------------------------------------------------------
Indications

  Encounter for other antenatal screening
  follow-up
  25 weeks gestation of pregnancy
  Obesity complicating pregnancy, second
  trimester
  History of cesarean delivery, currently
  pregnant (x2)
  Poor obstetric history: Previous gestational
  diabetes
 ----------------------------------------------------------------------
Vital Signs

 BMI:
Fetal Evaluation

 Num Of Fetuses:          1
 Fetal Heart Rate(bpm):   145
 Cardiac Activity:        Observed
 Presentation:            Cephalic
 Placenta:                Anterior
 P. Cord Insertion:       Previously Visualized

 Amniotic Fluid
 AFI FV:      Within normal limits

                             Largest Pocket(cm)

Biometry
 BPD:      64.6  mm     G. Age:  26w 1d         55  %    CI:        73.51   %    70 - 86
                                                         FL/HC:       20.6  %    18.6 -
 HC:      239.4  mm     G. Age:  26w 0d         37  %    HC/AC:       1.09       1.04 -
 AC:      219.8  mm     G. Age:  26w 3d         63  %    FL/BPD:      76.5  %    71 - 87
 FL:       49.4  mm     G. Age:  26w 5d         65  %    FL/AC:       22.5  %    20 - 24
 HUM:      46.2  mm     G. Age:  27w 2d         81  %
 LV:        5.1  mm

 Est. FW:     937   gm     2 lb 1 oz     68  %
OB History

 Gravidity:    3         Term:   2
 Living:       2
Gestational Age

 LMP:           22w 4d        Date:  09/23/17                 EDD:   06/30/18
 U/S Today:     26w 2d                                        EDD:   06/04/18
 Best:          25w 5d     Det. By:  U/S  (01/11/18)          EDD:   06/08/18
Anatomy

 Cranium:               Appears normal         Aortic Arch:            Appears normal
 Cavum:                 Previously seen        Ductal Arch:            Appears normal
 Ventricles:            Appears normal         Diaphragm:              Appears normal
 Choroid Plexus:        Previously seen        Stomach:                Appears normal, left
                                                                       sided
 Cerebellum:            Previously seen        Abdomen:                Appears normal
 Posterior Fossa:       Previously seen        Abdominal Wall:         Previously seen
 Nuchal Fold:           Not applicable (>20    Cord Vessels:           Previously seen
                        wks GA)
 Face:                  Orbits and profile     Kidneys:                Appear normal
                        previously seen
 Lips:                  Previously seen        Bladder:                Appears normal
 Thoracic:              Appears normal         Spine:                  Ltd views; no
                                                                       intracranial signs of
                                                                       NT
 Heart:                 Appears normal         Upper Extremities:      Previously seen
                        (4CH, axis, and situs
 RVOT:                  Appears normal         Lower Extremities:      Previously seen
 LVOT:                  Previously seen

 Other:  Fetus appears to be female. Heels and 5th digit prev visualized. Open
         hands prev visualized. Nasal bone prev visualized. Technically
         difficult due to maternal habitus and fetal position.
Cervix Uterus Adnexa

 Cervix
 Length:           4.64  cm.
 Normal appearance by transabdominal scan.

 Uterus
 No abnormality visualized.

 Left Ovary
 Within normal limits.
 Right Ovary
 Within normal limits.
Impression

 Normal interval growth.
 Suboptimal views of fetal spine seen again today due to fetal
 position.
Recommendations

 Follow up as clinically indicated.

## 2020-07-16 IMAGING — US US MFM OB FOLLOW-UP
1 series · 13 of 28 positions shown · non-contrast
Comparison: none

[Series 1: us mfm ob follow-up · 30 acquisitions, 13 frames shown]
[im 2/30]
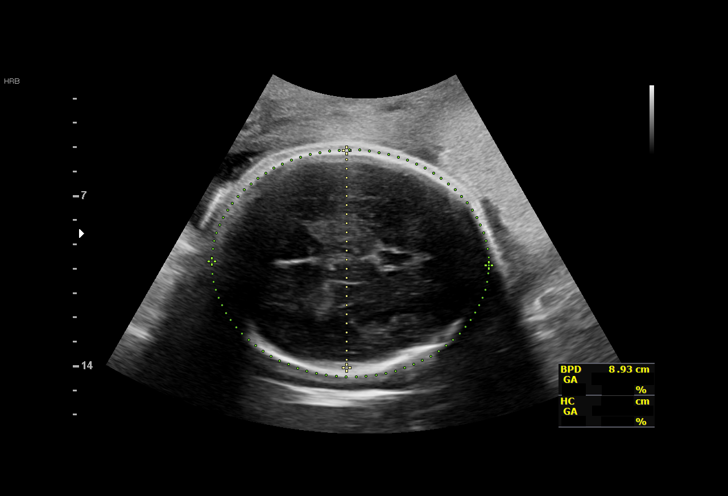
[im 4/30]
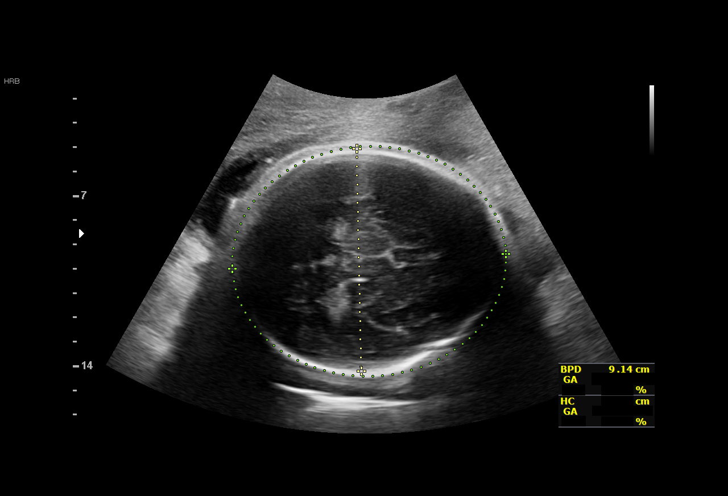
[im 6/30]
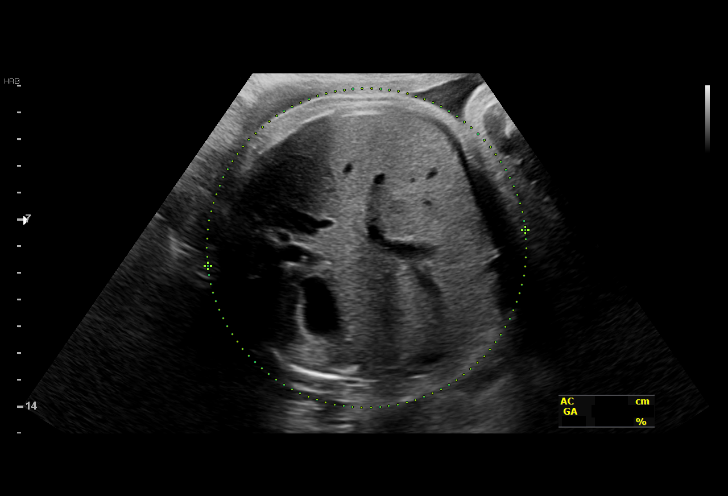
[im 8/30]
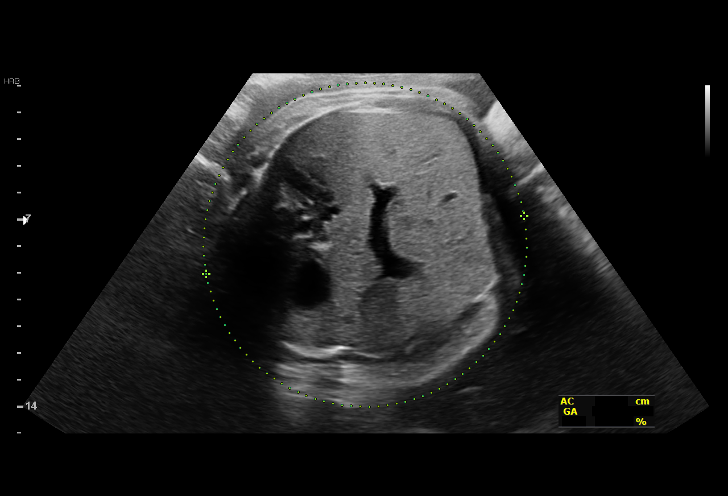
[im 10/30]
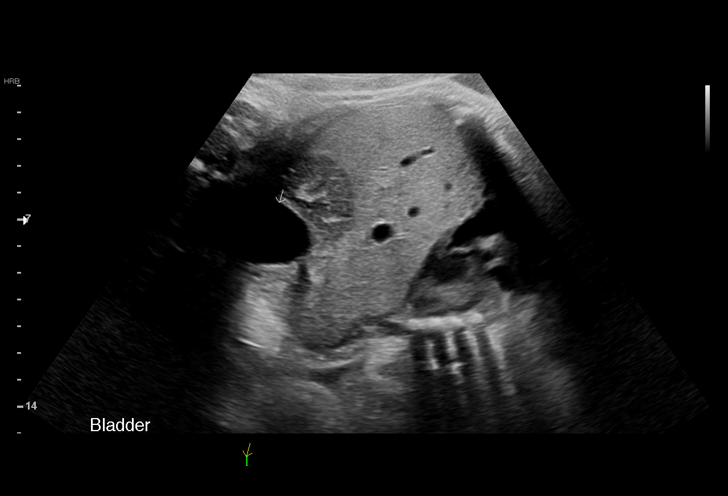
[im 12/30]
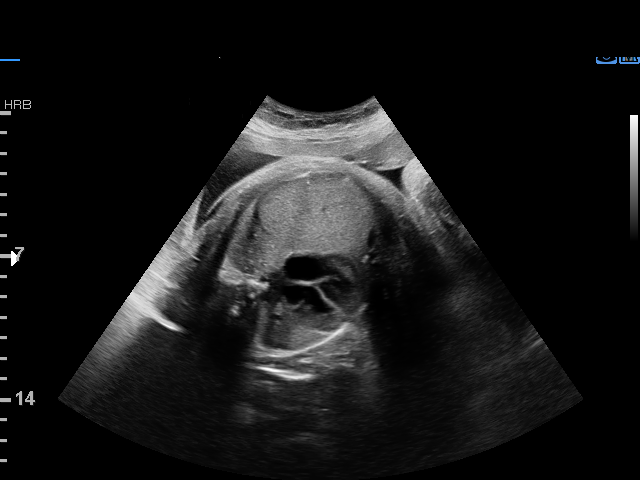
[im 16/30]
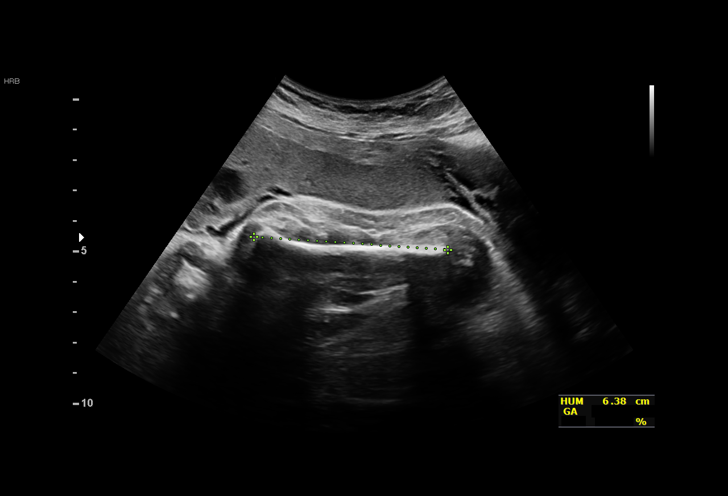
[im 18/30]
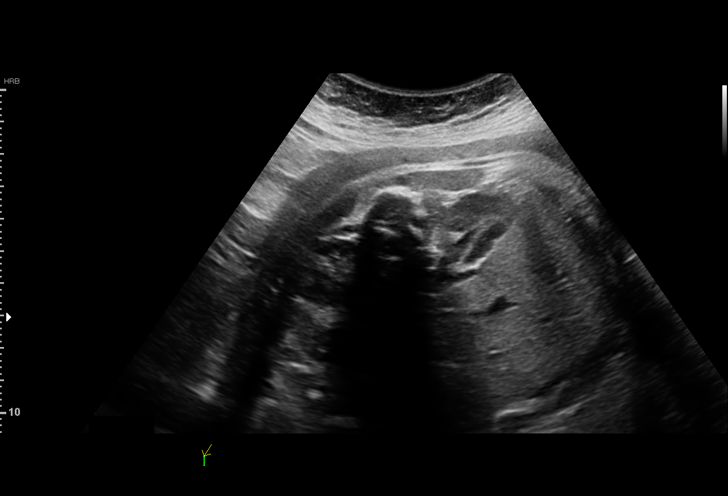
[im 20/30]
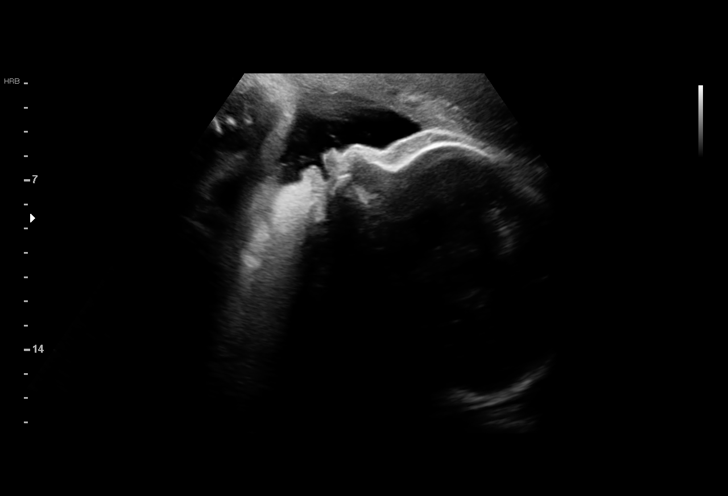
[im 22/30]
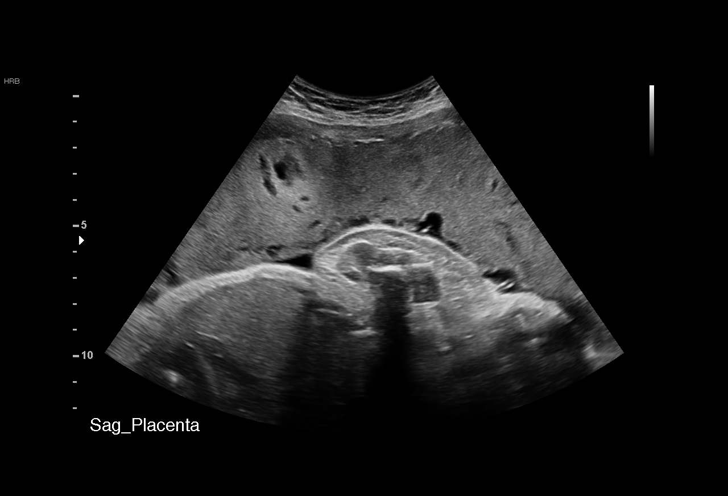
[im 24/30]
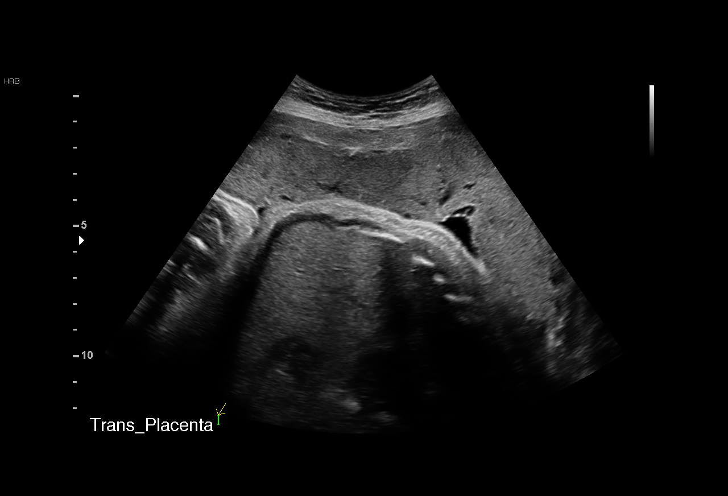
[im 26/30]
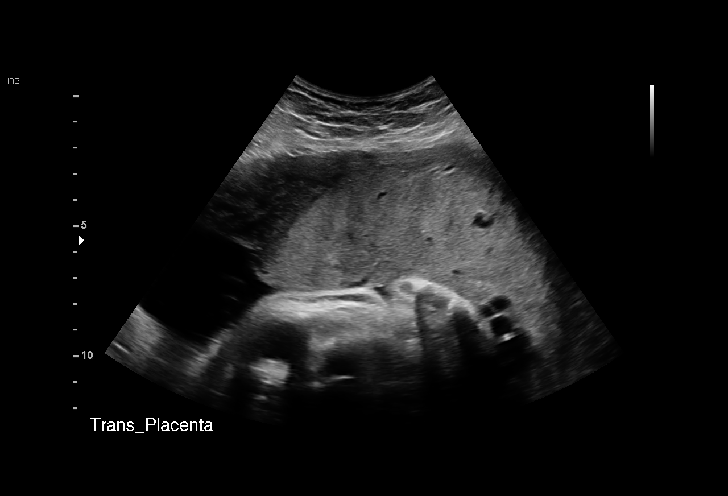
[im 28/30]
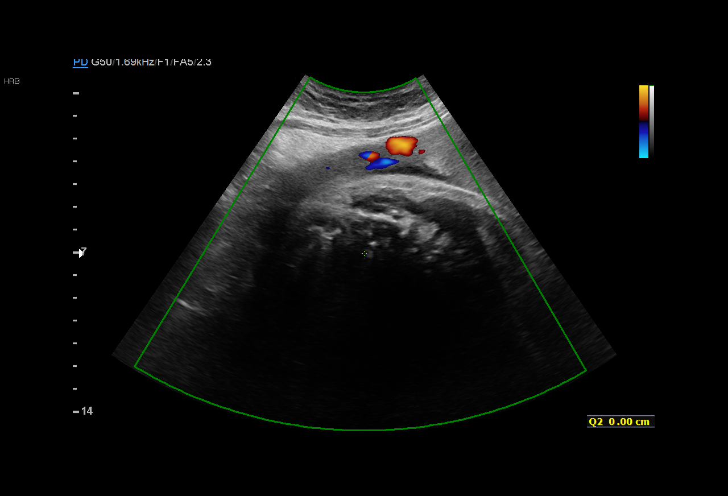

[13 of 28 positions shown; findings below may reference images not displayed]

[REDACTED]. [HOSPITAL],
                   [facility] CNM

 ----------------------------------------------------------------------

 ----------------------------------------------------------------------
Indications

  Gestational diabetes in pregnancy, diet
  controlled
  Poor obstetric history: Prior fetal
  macrosomia, antepartum (4678g)
  Encounter for other antenatal screening
  follow-up
  Obesity complicating pregnancy, second
  trimester
  History of cesarean delivery, currently
  pregnant (x2)
  Poor obstetric history: Previous gestational
  diabetes
  35 weeks gestation of pregnancy
 ----------------------------------------------------------------------
Vital Signs

 BMI:
Fetal Evaluation

 Num Of Fetuses:          1
 Fetal Heart Rate(bpm):   138
 Cardiac Activity:        Observed
 Presentation:            Cephalic
 Placenta:                Anterior
 P. Cord Insertion:       Previously Visualized

 Amniotic Fluid
 AFI FV:      Within normal limits

 AFI Sum(cm)     %Tile       Largest Pocket(cm)
 12              37
 RUQ(cm)       RLQ(cm)       LUQ(cm)        LLQ(cm)
 5.1           2.02          0
Biometry

 BPD:      90.2  mm     G. Age:  36w 4d         76  %    CI:        76.18   %    70 - 86
                                                         FL/HC:       21.7  %    20.1 -
 HC:      327.5  mm     G. Age:  37w 1d         52  %    HC/AC:       0.87       0.93 -
 AC:      374.9  mm     G. Age:  41w 3d       > 97  %    FL/BPD:      78.9  %    71 - 87
 FL:       71.2  mm     G. Age:  36w 4d         61  %    FL/AC:       19.0  %    20 - 24
 HUM:      63.8  mm     G. Age:  37w 0d         85  %

 LV:        4.5  mm

 Est. FW:    7300   gm     8 lb 4 oz   > 90  %
OB History

 Gravidity:    3         Term:   2
 Living:       2
Gestational Age

 LMP:           32w 5d        Date:  09/23/17                 EDD:   06/30/18
 U/S Today:     38w 0d                                        EDD:   05/24/18
 Best:          35w 6d     Det. By:  U/S  (01/11/18)          EDD:   06/08/18
Anatomy

 Cranium:               Appears normal         Aortic Arch:            Previously seen
 Cavum:                 Appears normal         Ductal Arch:            Previously seen
 Ventricles:            Appears normal         Diaphragm:              Appears normal
 Choroid Plexus:        Previously seen        Stomach:                Appears normal, left
                                                                       sided
 Cerebellum:            Previously seen        Abdomen:                Appears normal
 Posterior Fossa:       Previously seen        Abdominal Wall:         Previously seen
 Nuchal Fold:           Not applicable (>20    Cord Vessels:           Previously seen
                        wks GA)
 Face:                  Orbits and profile     Kidneys:                Appear normal
                        previously seen
 Lips:                  Previously seen        Bladder:                Appears normal
 Thoracic:              Appears normal         Spine:                  Ltd views; no
                                                                       intracranial signs of
                                                                       NT
 Heart:                 Previously seen        Upper Extremities:      Previously seen
 RVOT:                  Previously seen        Lower Extremities:      Previously seen
 LVOT:                  Previously seen

 Other:  Fetus appears to be female prev seen . Heels, 5th digit, open hands,
         and Nasal bone prev visualized. Technically difficult due to maternal
         habitus and fetal position.
Cervix Uterus Adnexa

 Cervix
 Not visualized (advanced GA >53wks)
Impression

 Normal interval growth.
Recommendations

 Follow up as clinically indicated
 Consider initiating testing if therapy is initiated for GDM
 management.

## 2020-07-21 ENCOUNTER — Other Ambulatory Visit: Payer: Self-pay

## 2020-07-21 ENCOUNTER — Other Ambulatory Visit (HOSPITAL_COMMUNITY)
Admission: RE | Admit: 2020-07-21 | Discharge: 2020-07-21 | Disposition: A | Discharge status: Home / Self Care | Payer: Medicaid Other | Source: Ambulatory Visit | Attending: Family | Admitting: Family

## 2020-07-21 ENCOUNTER — Encounter: Payer: Self-pay | Admitting: Family

## 2020-07-21 ENCOUNTER — Ambulatory Visit (INDEPENDENT_AMBULATORY_CARE_PROVIDER_SITE_OTHER): Payer: Medicaid Other | Admitting: Family

## 2020-07-21 VITALS — BP 123/75 | HR 85 | Ht 61.73 in | Wt 209.4 lb

## 2020-07-21 DIAGNOSIS — N760 Acute vaginitis: Secondary | ICD-10-CM

## 2020-07-21 DIAGNOSIS — Z7689 Persons encountering health services in other specified circumstances: Secondary | ICD-10-CM

## 2020-07-21 DIAGNOSIS — R350 Frequency of micturition: Secondary | ICD-10-CM | POA: Diagnosis not present

## 2020-07-21 DIAGNOSIS — B9689 Other specified bacterial agents as the cause of diseases classified elsewhere: Secondary | ICD-10-CM

## 2020-07-21 DIAGNOSIS — N910 Primary amenorrhea: Secondary | ICD-10-CM

## 2020-07-21 DIAGNOSIS — Z113 Encounter for screening for infections with a predominantly sexual mode of transmission: Secondary | ICD-10-CM | POA: Insufficient documentation

## 2020-07-21 LAB — POCT URINALYSIS DIP (CLINITEK)
Bilirubin, UA: NEGATIVE
Glucose, UA: NEGATIVE mg/dL
Ketones, POC UA: NEGATIVE mg/dL
Leukocytes, UA: NEGATIVE
Nitrite, UA: NEGATIVE
POC PROTEIN,UA: NEGATIVE
Spec Grav, UA: >=1.03 — AB (ref 1.010–1.025)
Urobilinogen, UA: 0.2 E.U./dL
pH, UA: 7.5 (ref 5.0–8.0)

## 2020-07-21 LAB — POCT URINE PREGNANCY: Preg Test, Ur: NEGATIVE

## 2020-07-21 NOTE — Progress Notes (Signed)
Establish care bladder -urinary frequency

## 2020-07-21 NOTE — Patient Instructions (Signed)
Return for annual physical examination, labs, and health maintenance. Arrive fasting meaning having had no food and/or nothing to drink for at least 8 hours prior to appointment.  Please take scheduled medications as normal. Thank you for choosing Primary Care at [facility] for your medical home!    Brooke Gregory was seen by Rema Fendt, NP today.   Brooke Gregory's primary care provider is Ndrew Creason Jodi Geralds, NP.   For the best care possible,  you should try to see Ricky Stabs, NP whenever you come to clinic.   We look forward to seeing you again soon!  If you have any questions about your visit today,  please call us at [phone]  Or feel free to reach your provider via MyChart.    Urinary Frequency, Adult Urinary frequency means urinating more often than usual. You may urinate every 1-2 hours even though you drink a normal amount of fluid and do not have a bladder infection or condition. Although you urinate more often than normal, the total amount of urine produced in a day is normal. With urinary frequency, you may have an urgent need to urinate often. The stress and anxiety of needing to find a bathroom quickly can make this urge worse. This condition may go away on its own or you may need treatment at home. Home treatment may include bladder training, exercises, taking medicines, or making changes to your diet. Follow these instructions at home: Bladder health  Keep a bladder diary if told by your health care provider. Keep track of: ? What you eat and drink. ? How often you urinate. ? How much you urinate.  Follow a bladder training program if told by your health care provider. This may include: ? Learning to delay going to the bathroom. ? Double urinating (voiding). This helps if you are not completely emptying your bladder. ? Scheduled voiding.  Do Kegel exercises as told by your health care provider. Kegel exercises strengthen the muscles that help control  urination, which may help the condition.   Eating and drinking  If told by your health care provider, make diet changes, such as: ? Avoiding caffeine. ? Drinking fewer fluids, especially alcohol. ? Not drinking in the evening. ? Avoiding foods or drinks that may irritate the bladder. These include coffee, tea, soda, artificial sweeteners, citrus, tomato-based foods, and chocolate. ? Eating foods that help prevent or ease constipation. Constipation can make this condition worse. Your health care provider may recommend that you:  Drink enough fluid to keep your urine pale yellow.  Take over-the-counter or prescription medicines.  Eat foods that are high in fiber, such as beans, whole grains, and fresh fruits and vegetables.  Limit foods that are high in fat and processed sugars, such as fried or sweet foods. General instructions  Take over-the-counter and prescription medicines only as told by your health care provider.  Keep all follow-up visits as told by your health care provider. This is important. Contact a health care provider if:  You start urinating more often.  You feel pain or irritation when you urinate.  You notice blood in your urine.  Your urine looks cloudy.  You develop a fever.  You begin vomiting. Get help right away if:  You are unable to urinate. Summary  Urinary frequency means urinating more often than usual. With urinary frequency, you may urinate every 1-2 hours even though you drink a normal amount of fluid and do not have a bladder infection  or other bladder condition.  Your health care provider may recommend that you keep a bladder diary, follow a bladder training program, or make dietary changes.  If told by your health care provider, do Kegel exercises to strengthen the muscles that help control urination.  Take over-the-counter and prescription medicines only as told by your health care provider.  Contact a health care provider if your  symptoms do not improve or get worse. This information is not intended to replace advice given to you by your health care provider. Make sure you discuss any questions you have with your health care provider. Document Revised: 10/19/2017 Document Reviewed: 10/19/2017 Elsevier Patient Education  2021 [employer].

## 2020-07-21 NOTE — Progress Notes (Signed)
Subjective:    Brooke Gregory - 30 y.o. female MRN [id number]  Date of birth: 1990-07-20  HPI  Brooke Gregory is to establish care. Patient has a PMH significant for gestational diabetes and bacterial vaginitis.    Current issues and/or concerns: 1. URINARY FREQUENCY: Began December 2021. Has to rush to the bathroom frequently. Has episodes of leaking urine if she does not get to the bathroom quick enough. Endorses odor. Denies abnormal bleeding and vaginal discharge. Urine yellow.  2. MENSES LATE: Tubal ligation 2 years ago. Menses has always been once monthly. She is currently 9 days late. Requesting pregnancy test.   ROS per HPI    Health Maintenance:  Health Maintenance Due  Topic Date Due  . Hepatitis C Screening  Never done  . COVID-19 Vaccine (1) Never done  . INFLUENZA VACCINE  11/24/2019    Past Medical History: Patient Active Problem List   Diagnosis Date Noted  . Bacterial vaginitis 07/22/2020  . Gestational diabetes 04/10/2018    Social History   reports that she has never smoked. She has never used smokeless tobacco. She reports that she does not drink alcohol and does not use drugs.   Family History  family history includes Diabetes in her mother; Hypertension in her mother; Kidney disease in her maternal grandmother.   Medications: reviewed and updated   Objective:   Physical Exam BP 123/75 (BP Location: Left Arm, Patient Position: Sitting)   Pulse 85   Ht 5' 1.73" (1.568 m)   Wt 209 lb 6.4 oz (95 kg)   SpO2 98%   BMI 38.63 kg/m  Physical Exam HENT:     Head: Normocephalic.  Eyes:     Extraocular Movements: Extraocular movements intact.     Conjunctiva/sclera: Conjunctivae normal.     Pupils: Pupils are equal, round, and reactive to light.  Cardiovascular:     Rate and Rhythm: Normal rate and regular rhythm.     Pulses: Normal pulses.     Heart sounds: Normal heart sounds.  Pulmonary:     Effort: Pulmonary effort is normal.      Breath sounds: Normal breath sounds.  Musculoskeletal:     Cervical back: Normal range of motion and neck supple.  Neurological:     Mental Status: She is alert.  Psychiatric:        Mood and Affect: Mood normal.        Behavior: Behavior normal.       Results for orders placed or performed in visit on 07/21/20  POCT URINALYSIS DIP (CLINITEK)  Result Value Ref Range   Color, UA yellow yellow   Clarity, UA clear clear   Glucose, UA negative negative mg/dL   Bilirubin, UA negative negative   Ketones, POC UA negative negative mg/dL   Spec Grav, UA >=0.998 (A) 1.010 - 1.025   Blood, UA trace-intact (A) negative   pH, UA 7.5 5.0 - 8.0   POC PROTEIN,UA negative negative, trace   Urobilinogen, UA 0.2 0.2 or 1.0 E.U./dL   Nitrite, UA Negative Negative   Leukocytes, UA Negative Negative  POCT urine pregnancy  Result Value Ref Range   Preg Test, Ur Negative Negative  Cervicovaginal ancillary only  Result Value Ref Range   Bacterial Vaginitis (gardnerella) Positive (A)    Candida Vaginitis Negative    Candida Glabrata Negative    Trichomonas Negative    Chlamydia Negative    Neisseria Gonorrhea Negative    Comment Normal Reference Range Candida Species -  Negative    Comment      Normal Reference Range Bacterial Vaginosis - Negative   Comment Normal Reference Range Candida Galbrata - Negative    Comment Normal Reference Range Trichomonas - Negative    Comment Normal Reference Ranger Chlamydia - Negative    Comment      Normal Reference Range Neisseria Gonorrhea - Negative    Assessment & Plan:  1. Encounter to establish care: - Patient presents today to establish care.  - Return for annual physical examination, labs, and health maintenance. Arrive fasting meaning having had no food and/or nothing to drink for at least 8 hours prior to appointment.  Please take scheduled medications as normal.  2. Urinary frequency: - Urine negative for urinary tract infection. - POCT  URINALYSIS DIP (CLINITEK)  3. Routine screening for STI (sexually transmitted infection): - Cervicovaginal self-swab to screen for chlamydia, gonorrhea, trichomonas, bacterial vaginitis, and candida vaginitis. - Cervicovaginal ancillary only  4. Delayed menses: - Urine pregnancy test negative.  - POCT urine pregnancy   Patient was given clear instructions to go to Emergency Department or return to medical center if symptoms don't improve, worsen, or new problems develop.The patient verbalized understanding.  I discussed the assessment and treatment plan with the patient. The patient was provided an opportunity to ask questions and all were answered. The patient agreed with the plan and demonstrated an understanding of the instructions.   The patient was advised to call back or seek an in-person evaluation if the symptoms worsen or if the condition fails to improve as anticipated.    Ricky Stabs, NP 07/22/2020, 1:05 PM Primary Care at [facility]

## 2020-07-22 ENCOUNTER — Encounter: Payer: Self-pay | Admitting: Family

## 2020-07-22 DIAGNOSIS — B9689 Other specified bacterial agents as the cause of diseases classified elsewhere: Secondary | ICD-10-CM | POA: Insufficient documentation

## 2020-07-22 DIAGNOSIS — N760 Acute vaginitis: Secondary | ICD-10-CM | POA: Insufficient documentation

## 2020-07-22 LAB — CERVICOVAGINAL ANCILLARY ONLY
Bacterial Vaginitis (gardnerella): POSITIVE — AB
Candida Glabrata: NEGATIVE
Candida Vaginitis: NEGATIVE
Chlamydia: NEGATIVE
Comment: NEGATIVE
Comment: NEGATIVE
Comment: NEGATIVE
Comment: NEGATIVE
Comment: NEGATIVE
Comment: NORMAL
Neisseria Gonorrhea: NEGATIVE
Trichomonas: NEGATIVE

## 2020-07-22 MED ORDER — METRONIDAZOLE 500 MG PO TABS: 500.0000 mg | ORAL_TABLET | Freq: Two times a day (BID) | ORAL | 0 refills | Status: AC

## 2020-07-22 NOTE — Progress Notes (Signed)
Urine negative for UTI.   Pregnancy test negative.   Chlamydia, gonorrhea, trichomonas, and yeast infection.   Vaginal swab positive for bacterial vaginitis, an overgrowth of normal bacteria in the vagina due to changes in pH often related to semen, menstrual periods, or soaps.   Metronidazole prescribed for bacterial vaginitis.

## 2020-07-22 NOTE — Progress Notes (Signed)
Chlamydia, gonorrhea, trichomonas, and yeast infection negative.

## 2020-08-15 NOTE — Progress Notes (Signed)
Patient ID: Brooke Gregory, female    DOB: April 14, 1991  MRN: [id number]  CC: Annual Physical Exam  Subjective: Brooke Gregory is a 30 y.o. female who presents for annual physical exam.  Her concerns today include: Would like retesting for bacterial vaginitis. Was taking prescription for this previously and had to stop medication mid-therapy because of stomach virus from what she believes was acquired from one of her children. Currently having no symptoms just wants to make sure its cleared.   Patient Active Problem List   Diagnosis Date Noted  . Bacterial vaginitis 07/22/2020  . Gestational diabetes 04/10/2018     No current outpatient medications on file prior to visit.   No current facility-administered medications on file prior to visit.    No Known Allergies  Social History   Socioeconomic History  . Marital status: Single    Spouse name: Not on file  . Number of children: Not on file  . Years of education: Not on file  . Highest education level: Not on file  Occupational History  . Not on file  Tobacco Use  . Smoking status: Never Smoker  . Smokeless tobacco: Never Used  Vaping Use  . Vaping Use: Never used  Substance and Sexual Activity  . Alcohol use: Never  . Drug use: Never  . Sexual activity: Not Currently    Partners: Male  Other Topics Concern  . Not on file  Social History Narrative  . Not on file   Social Determinants of Health   Financial Resource Strain: Not on file  Food Insecurity: Not on file  Transportation Needs: Not on file  Physical Activity: Not on file  Stress: Not on file  Social Connections: Not on file  Intimate Partner Violence: Not on file    Family History  Problem Relation Age of Onset  . Hypertension Mother   . Diabetes Mother   . Kidney disease Maternal Grandmother     Past Surgical History:  Procedure Laterality Date  . CESAREAN SECTION    . CESAREAN SECTION WITH BILATERAL TUBAL LIGATION Bilateral 05/26/2018    Procedure: CESAREAN SECTION WITH BILATERAL TUBAL LIGATION;  Surgeon: Catalina Antigua, MD;  Location: [facility];  Service: Obstetrics;  Laterality: Bilateral;  . TUBAL LIGATION      ROS: Review of Systems Negative except as stated above  PHYSICAL EXAM: BP 137/87 (BP Location: Left Arm, Patient Position: Sitting)   Pulse 66   Ht 5' 1.73" (1.568 m)   Wt 199 lb (90.3 kg)   SpO2 94%   BMI 36.71 kg/m   Wt Readings from Last 3 Encounters:  08/17/20 199 lb (90.3 kg)  07/21/20 209 lb 6.4 oz (95 kg)  09/15/18 200 lb (90.7 kg)    Physical Exam Exam conducted with a chaperone present.  HENT:     Head: Normocephalic and atraumatic.     Right Ear: Tympanic membrane, ear canal and external ear normal.     Left Ear: Tympanic membrane, ear canal and external ear normal.     Nose: Nose normal.     Mouth/Throat:     Mouth: Mucous membranes are moist.     Pharynx: Oropharynx is clear.  Eyes:     Extraocular Movements: Extraocular movements intact.     Conjunctiva/sclera: Conjunctivae normal.     Pupils: Pupils are equal, round, and reactive to light.  Cardiovascular:     Rate and Rhythm: Normal rate and regular rhythm.     Pulses: Normal  pulses.     Heart sounds: Normal heart sounds.  Pulmonary:     Effort: Pulmonary effort is normal.     Breath sounds: Normal breath sounds.  Chest:  Breasts:     Right: Normal.     Left: Normal.      Comments: Margorie John, CMA present during examination.  Abdominal:     General: Bowel sounds are normal.     Palpations: Abdomen is soft.  Genitourinary:    Comments: Patient declined examination.  Musculoskeletal:        General: Normal range of motion.  Skin:    General: Skin is warm and dry.     Capillary Refill: Capillary refill takes less than 2 seconds.  Neurological:     General: No focal deficit present.     Mental Status: She is alert and oriented to person, place, and time.  Psychiatric:        Mood and Affect: Mood  normal.        Behavior: Behavior normal.     ASSESSMENT AND PLAN: 1. Annual physical exam: - Counseled on 150 minutes of exercise per week as tolerated, healthy eating (including decreased daily intake of saturated fats, cholesterol, added sugars, sodium), STI prevention, and routine healthcare maintenance.  2. Screening for metabolic disorder: - CMP to check kidney function, liver function, and electrolyte balance.  - Comprehensive metabolic panel  3. Screening for deficiency anemia: - CBC to screen for anemia. - CBC  4. Diabetes mellitus screening: - Hemoglobin A1c to screen for pre-diabetes/diabetes. - Hemoglobin A1c  5. Screening cholesterol level: - Lipid panel to screen for high cholesterol.  - Lipid panel  6. Thyroid disorder screen: - TSH to check thyroid function.  - TSH+T4F+T3Free  7. Need for hepatitis C screening test: - Hepatitis C antibody to screen for hepatitis C.  - Hepatitis C Antibody  8. Routine screening for STI (sexually transmitted infection): - Cervicovaginal self-swab to screen for chlamydia, gonorrhea, trichomonas, bacterial vaginitis, and candida vaginitis.\ - Cervicovaginal ancillary only   Patient was given the opportunity to ask questions.  Patient verbalized understanding of the plan and was able to repeat key elements of the plan. Patient was given clear instructions to go to Emergency Department or return to medical center if symptoms don't improve, worsen, or new problems develop.The patient verbalized understanding.   Orders Placed This Encounter  Procedures  . Hepatitis C Antibody  . CBC  . Comprehensive metabolic panel  . Lipid panel  . TSH+T4F+T3Free  . Hemoglobin A1c     Requested Prescriptions    No prescriptions requested or ordered in this encounter    Follow-up with primary provider as scheduled.   Rema Fendt, NP

## 2020-08-17 ENCOUNTER — Other Ambulatory Visit: Payer: Self-pay

## 2020-08-17 ENCOUNTER — Other Ambulatory Visit (HOSPITAL_COMMUNITY)
Admission: RE | Admit: 2020-08-17 | Discharge: 2020-08-17 | Disposition: A | Discharge status: Home / Self Care | Payer: Medicaid Other | Source: Ambulatory Visit | Attending: Family | Admitting: Family

## 2020-08-17 ENCOUNTER — Ambulatory Visit (INDEPENDENT_AMBULATORY_CARE_PROVIDER_SITE_OTHER): Payer: Medicaid Other | Admitting: Family

## 2020-08-17 ENCOUNTER — Encounter: Payer: Self-pay | Admitting: Family

## 2020-08-17 VITALS — BP 137/87 | HR 66 | Ht 61.73 in | Wt 199.0 lb

## 2020-08-17 DIAGNOSIS — Z130 Encounter for screening for diseases of the blood and blood-forming organs and certain disorders involving the immune mechanism: Secondary | ICD-10-CM | POA: Diagnosis not present

## 2020-08-17 DIAGNOSIS — Z0000 Encounter for general adult medical examination without abnormal findings: Secondary | ICD-10-CM | POA: Diagnosis not present

## 2020-08-17 DIAGNOSIS — Z13228 Encounter for screening for other metabolic disorders: Secondary | ICD-10-CM | POA: Diagnosis not present

## 2020-08-17 DIAGNOSIS — Z1329 Encounter for screening for other suspected endocrine disorder: Secondary | ICD-10-CM

## 2020-08-17 DIAGNOSIS — N760 Acute vaginitis: Secondary | ICD-10-CM | POA: Insufficient documentation

## 2020-08-17 DIAGNOSIS — Z131 Encounter for screening for diabetes mellitus: Secondary | ICD-10-CM

## 2020-08-17 DIAGNOSIS — B9689 Other specified bacterial agents as the cause of diseases classified elsewhere: Secondary | ICD-10-CM | POA: Insufficient documentation

## 2020-08-17 DIAGNOSIS — Z1159 Encounter for screening for other viral diseases: Secondary | ICD-10-CM

## 2020-08-17 DIAGNOSIS — Z13220 Encounter for screening for lipoid disorders: Secondary | ICD-10-CM

## 2020-08-17 DIAGNOSIS — Z113 Encounter for screening for infections with a predominantly sexual mode of transmission: Secondary | ICD-10-CM

## 2020-08-17 NOTE — Patient Instructions (Signed)
 Annual physical exam and labs today.   Follow-up with primary provider as scheduled. Preventive Care 21-31 Years Old, Female Preventive care refers to lifestyle choices and visits with your health care provider that can promote health and wellness. This includes:  A yearly physical exam. This is also called an annual wellness visit.  Regular dental and eye exams.  Immunizations.  Screening for certain conditions.  Healthy lifestyle choices, such as: ? Eating a healthy diet. ? Getting regular exercise. ? Not using drugs or products that contain nicotine and tobacco. ? Limiting alcohol use. What can I expect for my preventive care visit? Physical exam Your health care provider may check your:  Height and weight. These may be used to calculate your BMI (body mass index). BMI is a measurement that tells if you are at a healthy weight.  Heart rate and blood pressure.  Body temperature.  Skin for abnormal spots. Counseling Your health care provider may ask you questions about your:  Past medical problems.  Family's medical history.  Alcohol, tobacco, and drug use.  Emotional well-being.  Home life and relationship well-being.  Sexual activity.  Diet, exercise, and sleep habits.  Work and work environment.  Access to firearms.  Method of birth control.  Menstrual cycle.  Pregnancy history. What immunizations do I need? Vaccines are usually given at various ages, according to a schedule. Your health care provider will recommend vaccines for you based on your age, medical history, and lifestyle or other factors, such as travel or where you work.   What tests do I need? Blood tests  Lipid and cholesterol levels. These may be checked every 5 years starting at age 20.  Hepatitis C test.  Hepatitis B test. Screening  Diabetes screening. This is done by checking your blood sugar (glucose) after you have not eaten for a while (fasting).  STD (sexually  transmitted disease) testing, if you are at risk.  BRCA-related cancer screening. This may be done if you have a family history of breast, ovarian, tubal, or peritoneal cancers.  Pelvic exam and Pap test. This may be done every 3 years starting at age 21. Starting at age 30, this may be done every 5 years if you have a Pap test in combination with an HPV test. Talk with your health care provider about your test results, treatment options, and if necessary, the need for more tests.   Follow these instructions at home: Eating and drinking  Eat a healthy diet that includes fresh fruits and vegetables, whole grains, lean protein, and low-fat dairy products.  Take vitamin and mineral supplements as recommended by your health care provider.  Do not drink alcohol if: ? Your health care provider tells you not to drink. ? You are pregnant, may be pregnant, or are planning to become pregnant.  If you drink alcohol: ? Limit how much you have to 0-1 drink a day. ? Be aware of how much alcohol is in your drink. In the [place] one drink equals one 12 oz bottle of beer (355 mL), one 5 oz glass of wine (148 mL), or one 1 oz glass of hard liquor (44 mL).   Lifestyle  Take daily care of your teeth and gums. Brush your teeth every morning and night with fluoride toothpaste. Floss one time each day.  Stay active. Exercise for at least 30 minutes 5 or more days each week.  Do not use any products that contain nicotine or tobacco, such as cigarettes, e-cigarettes, and   chewing tobacco. If you need help quitting, ask your health care provider.  Do not use drugs.  If you are sexually active, practice safe sex. Use a condom or other form of protection to prevent STIs (sexually transmitted infections).  If you do not wish to become pregnant, use a form of birth control. If you plan to become pregnant, see your health care provider for a prepregnancy visit.  Find healthy ways to cope with stress, such  as: ? Meditation, yoga, or listening to music. ? Journaling. ? Talking to a trusted person. ? Spending time with friends and family. Safety  Always wear your seat belt while driving or riding in a vehicle.  Do not drive: ? If you have been drinking alcohol. Do not ride with someone who has been drinking. ? When you are tired or distracted. ? While texting.  Wear a helmet and other protective equipment during sports activities.  If you have firearms in your house, make sure you follow all gun safety procedures.  Seek help if you have been physically or sexually abused. What's next?  Go to your health care provider once a year for an annual wellness visit.  Ask your health care provider how often you should have your eyes and teeth checked.  Stay up to date on all vaccines. This information is not intended to replace advice given to you by your health care provider. Make sure you discuss any questions you have with your health care provider. Document Revised: 12/08/2019 Document Reviewed: 12/21/2017 Elsevier Patient Education  2021 Elsevier Inc.  

## 2020-08-17 NOTE — Progress Notes (Signed)
Physical Wants to check for BV

## 2020-08-18 LAB — CBC
Hematocrit: 36.3 % (ref 34.0–46.6)
Hemoglobin: 11.3 g/dL (ref 11.1–15.9)
MCH: 25.1 pg — ABNORMAL LOW (ref 26.6–33.0)
MCHC: 31.1 g/dL — ABNORMAL LOW (ref 31.5–35.7)
MCV: 81 fL (ref 79–97)
Platelets: 443 10*3/uL (ref 150–450)
RBC: 4.51 x10E6/uL (ref 3.77–5.28)
RDW: 14.4 % (ref 11.7–15.4)
WBC: 6.5 10*3/uL (ref 3.4–10.8)

## 2020-08-18 LAB — COMPREHENSIVE METABOLIC PANEL
ALT: 12 IU/L (ref 0–32)
AST: 14 IU/L (ref 0–40)
Albumin/Globulin Ratio: 1.4 (ref 1.2–2.2)
Albumin: 4.6 g/dL (ref 3.9–5.0)
Alkaline Phosphatase: 61 IU/L (ref 44–121)
BUN/Creatinine Ratio: 10 (ref 9–23)
BUN: 9 mg/dL (ref 6–20)
Bilirubin Total: 0.4 mg/dL (ref 0.0–1.2)
CO2: 23 mmol/L (ref 20–29)
Calcium: 9.5 mg/dL (ref 8.7–10.2)
Chloride: 100 mmol/L (ref 96–106)
Creatinine, Ser: 0.9 mg/dL (ref 0.57–1.00)
Globulin, Total: 3.2 g/dL (ref 1.5–4.5)
Glucose: 83 mg/dL (ref 65–99)
Potassium: 4.1 mmol/L (ref 3.5–5.2)
Sodium: 139 mmol/L (ref 134–144)
Total Protein: 7.8 g/dL (ref 6.0–8.5)
eGFR: 88 mL/min/{1.73_m2} (ref >59)

## 2020-08-18 LAB — CERVICOVAGINAL ANCILLARY ONLY
Bacterial Vaginitis (gardnerella): NEGATIVE
Candida Glabrata: NEGATIVE
Candida Vaginitis: NEGATIVE
Chlamydia: NEGATIVE
Comment: NEGATIVE
Comment: NEGATIVE
Comment: NEGATIVE
Comment: NEGATIVE
Comment: NEGATIVE
Comment: NORMAL
Neisseria Gonorrhea: NEGATIVE
Trichomonas: NEGATIVE

## 2020-08-18 LAB — TSH+T4F+T3FREE
Free T4: 1.61 ng/dL (ref 0.82–1.77)
T3, Free: 3.1 pg/mL (ref 2.0–4.4)
TSH: 0.694 u[IU]/mL (ref 0.450–4.500)

## 2020-08-18 LAB — LIPID PANEL
Chol/HDL Ratio: 4.4 ratio (ref 0.0–4.4)
Cholesterol, Total: 172 mg/dL (ref 100–199)
HDL: 39 mg/dL — ABNORMAL LOW (ref >39)
LDL Chol Calc (NIH): 120 mg/dL — ABNORMAL HIGH (ref 0–99)
Triglycerides: 65 mg/dL (ref 0–149)
VLDL Cholesterol Cal: 13 mg/dL (ref 5–40)

## 2020-08-18 LAB — HEMOGLOBIN A1C
Est. average glucose Bld gHb Est-mCnc: 111 mg/dL
Hgb A1c MFr Bld: 5.5 % (ref 4.8–5.6)

## 2020-08-18 LAB — HEPATITIS C ANTIBODY: Hep C Virus Ab: <0.1 s/co ratio (ref 0.0–0.9)

## 2020-08-18 NOTE — Progress Notes (Signed)
Kidney function normal.   Liver function normal.   Thyroid function normal.   Hepatitis C negative.   No diabetes.   Cholesterol higher than expected. High cholesterol may increase risk of heart attack and/or stroke. Consider eating more fruits, vegetables, and lean baked meats such as chicken or fish. Moderate intensity exercise at least 150 minutes as tolerated per week may help as well. Encouraged to have rechecked in 6 months.   Mild anemia. Increase iron-rich foods such as dark green leafy vegetables and dried fruit such as raisins and apricots.

## 2020-08-18 NOTE — Progress Notes (Signed)
Negative gonorrhea, chlamydia, trichomonas, bacterial vaginitis, and yeast infection.

## 2020-10-30 ENCOUNTER — Encounter (HOSPITAL_COMMUNITY): Payer: Self-pay | Admitting: Emergency Medicine

## 2020-10-30 ENCOUNTER — Other Ambulatory Visit: Payer: Self-pay

## 2020-10-30 ENCOUNTER — Emergency Department (HOSPITAL_COMMUNITY)
Admission: EM | Admit: 2020-10-30 | Discharge: 2020-10-31 | Disposition: A | Discharge status: Home / Self Care | Payer: Medicaid Other | Attending: Emergency Medicine | Admitting: Emergency Medicine

## 2020-10-30 DIAGNOSIS — L0201 Cutaneous abscess of face: Secondary | ICD-10-CM | POA: Diagnosis not present

## 2020-10-30 DIAGNOSIS — Z139 Encounter for screening, unspecified: Secondary | ICD-10-CM

## 2020-10-30 NOTE — ED Triage Notes (Signed)
Pt c/o recurrent abscess to the right side of face. Rating pain 8/10. Denies facial numbness. States abscess in the past resolved with PO ABX. Denies Fevers/chills.

## 2020-10-31 MED ORDER — CEPHALEXIN 500 MG PO CAPS: 500.0000 mg | ORAL_CAPSULE | Freq: Three times a day (TID) | ORAL | 0 refills | Status: AC

## 2020-10-31 NOTE — Discharge Instructions (Addendum)
Call your primary care doctor or specialist as discussed in the next 2-3 days.   Return immediately back to the ER if:  Your symptoms worsen within the next 12-24 hours. You develop new symptoms such as new fevers, persistent vomiting, new pain, shortness of breath, or new weakness or numbness, or if you have any other concerns.  

## 2020-10-31 NOTE — ED Provider Notes (Signed)
[facility] EMERGENCY DEPARTMENT Provider Note   CSN: [id number] Arrival date & time: 10/30/20  2319     History Chief Complaint  Patient presents with   Abscess    Brooke Gregory is a 30 y.o. female.  Patient concerned about recurrent abscess to the right side of the face.  She states she felt a little bit of fullness there for the past 2 days.  Denies fevers or cough or chills.  No vomiting or diarrhea.  She states in the past she was told she had an abscess there which improved with a few days of antibiotics.  She is concerned this may be recurring again.      Past Medical History:  Diagnosis Date   Gestational diabetes     Patient Active Problem List   Diagnosis Date Noted   Bacterial vaginitis 07/22/2020   Gestational diabetes 04/10/2018    Past Surgical History:  Procedure Laterality Date   CESAREAN SECTION     CESAREAN SECTION WITH BILATERAL TUBAL LIGATION Bilateral 05/26/2018   Procedure: CESAREAN SECTION WITH BILATERAL TUBAL LIGATION;  Surgeon: Catalina Antigua, MD;  Location: [facility];  Service: Obstetrics;  Laterality: Bilateral;   TUBAL LIGATION       OB History     Gravida  3   Para  3   Term  3   Preterm      AB      Living  3      SAB      IAB      Ectopic      Multiple  0   Live Births  3           Family History  Problem Relation Age of Onset   Hypertension Mother    Diabetes Mother    Kidney disease Maternal Grandmother     Social History   Tobacco Use   Smoking status: Never   Smokeless tobacco: Never  Vaping Use   Vaping Use: Never used  Substance Use Topics   Alcohol use: Never   Drug use: Never    Home Medications Prior to Admission medications   Medication Sig Start Date End Date Taking? Authorizing Provider  cephALEXin (KEFLEX) 500 MG capsule Take 1 capsule (500 mg total) by mouth 3 (three) times daily for 5 days. 10/31/20 11/05/20 Yes Cheryll Cockayne, MD    Allergies     Patient has no known allergies.  Review of Systems   Review of Systems  Constitutional:  Negative for fever.  HENT:  Negative for ear pain.   Eyes:  Negative for pain.  Respiratory:  Negative for cough.   Cardiovascular:  Negative for chest pain.  Gastrointestinal:  Negative for abdominal pain.  Genitourinary:  Negative for flank pain.  Musculoskeletal:  Negative for back pain.  Skin:  Negative for rash.  Neurological:  Negative for headaches.   Physical Exam Updated Vital Signs BP (!) 141/108 (BP Location: Right Arm)   Pulse 68   Temp 98.5 F (36.9 C) (Oral)   Resp 18   Ht 5' 1.73" (1.568 m)   Wt 90.3 kg   SpO2 100%   BMI 36.72 kg/m   Physical Exam Constitutional:      General: She is not in acute distress.    Appearance: Normal appearance.  HENT:     Head: Normocephalic.     Comments: No significant tenderness or fluctuance or skin lesion noted on the right face.  Nose: Nose normal.  Eyes:     Extraocular Movements: Extraocular movements intact.  Cardiovascular:     Rate and Rhythm: Normal rate.  Pulmonary:     Effort: Pulmonary effort is normal.  Musculoskeletal:        General: Normal range of motion.     Cervical back: Normal range of motion.  Neurological:     General: No focal deficit present.     Mental Status: She is alert. Mental status is at baseline.    ED Results / Procedures / Treatments   Labs (all labs ordered are listed, but only abnormal results are displayed) Labs Reviewed - No data to display  EKG None  Radiology No results found.  Procedures Procedures   Medications Ordered in ED Medications - No data to display  ED Course  I have reviewed the triage vital signs and the nursing notes.  Pertinent labs & imaging results that were available during my care of the patient were reviewed by me and considered in my medical decision making (see chart for details).    MDM Rules/Calculators/A&P                          There is  no evidence of cellulitis or abnormal warmth or swelling or tenderness or fluctuance on the right side of the face.  However the patient is convinced that she feels something.  Requesting antibiotics.  We will give her a prescription of antibiotics and advising outpatient follow-up with her doctor within the week.  Advised return if she has fevers or swelling or pain.  Final Clinical Impression(s) / ED Diagnoses Final diagnoses:  Encounter for medical screening examination    Rx / DC Orders ED Discharge Orders          Ordered    cephALEXin (KEFLEX) 500 MG capsule  3 times daily        10/31/20 0256             Cheryll Cockayne, MD 10/31/20 [phone]

## 2021-04-01 ENCOUNTER — Other Ambulatory Visit: Payer: Self-pay

## 2021-04-01 ENCOUNTER — Encounter: Payer: Self-pay | Admitting: Family

## 2021-04-01 ENCOUNTER — Ambulatory Visit (INDEPENDENT_AMBULATORY_CARE_PROVIDER_SITE_OTHER): Payer: Medicaid Other | Admitting: Family

## 2021-04-01 ENCOUNTER — Other Ambulatory Visit (HOSPITAL_COMMUNITY)
Admission: RE | Admit: 2021-04-01 | Discharge: 2021-04-01 | Disposition: A | Discharge status: Home / Self Care | Payer: Medicaid Other | Source: Ambulatory Visit | Attending: Family | Admitting: Family

## 2021-04-01 VITALS — BP 133/87 | HR 91 | Temp 98.3°F | Resp 18 | Ht 61.73 in

## 2021-04-01 DIAGNOSIS — L987 Excessive and redundant skin and subcutaneous tissue: Secondary | ICD-10-CM

## 2021-04-01 DIAGNOSIS — Z202 Contact with and (suspected) exposure to infections with a predominantly sexual mode of transmission: Secondary | ICD-10-CM | POA: Insufficient documentation

## 2021-04-01 LAB — POCT URINALYSIS DIP (CLINITEK)
Bilirubin, UA: NEGATIVE
Glucose, UA: NEGATIVE mg/dL
Ketones, POC UA: NEGATIVE mg/dL
Leukocytes, UA: NEGATIVE
Nitrite, UA: NEGATIVE
POC PROTEIN,UA: NEGATIVE
Spec Grav, UA: 1.01 (ref 1.010–1.025)
Urobilinogen, UA: 0.2 E.U./dL
pH, UA: 5.5 (ref 5.0–8.0)

## 2021-04-01 NOTE — Progress Notes (Signed)
Patient ID: Brooke Gregory, female    DOB: 12/12/1990  MRN: [id number]  CC: STD Screening  Subjective: Brooke Gregory is a 30 y.o. female who presents for STD screening.   Her concerns today include:  Reports on last week discovered in her partners phone that he had intercourse with a prostitute. Patient reports she found where he did pay for intercourse. Reports both her partner and the prostitute say they used protection. Patient unsure if partner slept with one prostitute or possibly more.   Reports since losing weight has excess skin she would like to have removed.    Patient Active Problem List   Diagnosis Date Noted   Bacterial vaginitis 07/22/2020   Gestational diabetes 04/10/2018     No current outpatient medications on file prior to visit.   No current facility-administered medications on file prior to visit.    No Known Allergies  Social History   Socioeconomic History   Marital status: Single    Spouse name: Not on file   Number of children: Not on file   Years of education: Not on file   Highest education level: Not on file  Occupational History   Not on file  Tobacco Use   Smoking status: Never   Smokeless tobacco: Never  Vaping Use   Vaping Use: Never used  Substance and Sexual Activity   Alcohol use: Never   Drug use: Never   Sexual activity: Not Currently    Partners: Male  Other Topics Concern   Not on file  Social History Narrative   Not on file   Social Determinants of Health   Financial Resource Strain: Not on file  Food Insecurity: Not on file  Transportation Needs: Not on file  Physical Activity: Not on file  Stress: Not on file  Social Connections: Not on file  Intimate Partner Violence: Not on file    Family History  Problem Relation Age of Onset   Hypertension Mother    Diabetes Mother    Kidney disease Maternal Grandmother     Past Surgical History:  Procedure Laterality Date   CESAREAN SECTION     CESAREAN  SECTION WITH BILATERAL TUBAL LIGATION Bilateral 05/26/2018   Procedure: CESAREAN SECTION WITH BILATERAL TUBAL LIGATION;  Surgeon: Catalina Antigua, MD;  [facility]: [facility];  Service: Obstetrics;  Laterality: Bilateral;   TUBAL LIGATION      ROS: Review of Systems Negative except as stated above  PHYSICAL EXAM: BP 133/87 (BP Location: Left Arm, Patient Position: Sitting, Cuff Size: Normal)   Pulse 91   Temp 98.3 F (36.8 C)   Resp 18   Ht 5' 1.73" (1.568 m)   SpO2 98%   Breastfeeding No   BMI 36.71 kg/m   Physical Exam HENT:     Head: Normocephalic and atraumatic.  Eyes:     Extraocular Movements: Extraocular movements intact.     Conjunctiva/sclera: Conjunctivae normal.     Pupils: Pupils are equal, round, and reactive to light.  Cardiovascular:     Rate and Rhythm: Normal rate and regular rhythm.     Pulses: Normal pulses.     Heart sounds: Normal heart sounds.  Pulmonary:     Effort: Pulmonary effort is normal.     Breath sounds: Normal breath sounds.  Musculoskeletal:     Cervical back: Normal range of motion and neck supple.  Neurological:     General: No focal deficit present.     Mental Status: She is  alert and oriented to person, place, and time.  Psychiatric:        Mood and Affect: Mood normal.        Behavior: Behavior normal.   Results for orders placed or performed in visit on 04/01/21  POCT URINALYSIS DIP (CLINITEK)  Result Value Ref Range   Color, UA yellow yellow   Clarity, UA clear clear   Glucose, UA negative negative mg/dL   Bilirubin, UA negative negative   Ketones, POC UA negative negative mg/dL   Spec Grav, UA 5.009 3.818 - 1.025   Blood, UA trace-intact (A) negative   pH, UA 5.5 5.0 - 8.0   POC PROTEIN,UA negative negative, trace   Urobilinogen, UA 0.2 0.2 or 1.0 E.U./dL   Nitrite, UA Negative Negative   Leukocytes, UA Negative Negative    ASSESSMENT AND PLAN: 1. Possible exposure to STD: - Screening for sexually transmitted  infections.  - Urinalysis today in office no evidence of urinary tract infection. - Cervicovaginal ancillary only - POCT URINALYSIS DIP (CLINITEK) - HIV antibody (with reflex) - RPR  2. Excess skin of abdomen: - Per patient preference referral to Plastic Surgery for further evaluation and management.  - Ambulatory referral to Plastic Surgery   Patient was given the opportunity to ask questions.  Patient verbalized understanding of the plan and was able to repeat key elements of the plan. Patient was given clear instructions to go to Emergency Department or return to medical center if symptoms don't improve, worsen, or new problems develop.The patient verbalized understanding.   Orders Placed This Encounter  Procedures   HIV antibody (with reflex)   RPR   Ambulatory referral to Plastic Surgery   POCT URINALYSIS DIP (CLINITEK)    Follow-up with primary provider as scheduled.   Rema Fendt, NP

## 2021-04-01 NOTE — Progress Notes (Signed)
No urinary tract infection.

## 2021-04-01 NOTE — Progress Notes (Signed)
Pt presents for possible exposure to STD, pt states that partner cheated on her with prostitute  desires all STD screenings

## 2021-04-01 NOTE — Patient Instructions (Signed)
Safe Sex Practicing safe sex means taking steps before and during sex to reduce your risk of: Getting an STI (sexually transmitted infection). Giving your partner an STI. Unwanted or unplanned pregnancy. How to practice safe sex Ways you can practice safe sex  Limit your sexual partners to only one partner who is having sex with only you. Avoid using alcohol and drugs before having sex. Alcohol and drugs can affect your judgment. Before having sex with a new partner: Talk to your partner about past partners, past STIs, and drug use. Get screened for STIs and discuss the results with your partner. Ask your partner to get screened too. Check your body regularly for sores, blisters, rashes, or unusual discharge. If you notice any of these problems, visit your health care provider. Avoid sexual contact if you have symptoms of an infection or you are being treated for an STI. While having sex, use a condom. Make sure to: Use a condom every time you have vaginal, oral, or anal sex. Both females and males should wear condoms during oral sex. Keep condoms in place from the beginning to the end of sexual activity. Use a latex condom, if possible. Latex condoms offer the best protection. Use only water-based lubricants with a condom. Using petroleum-based lubricants or oils will weaken the condom and increase the chance that it will break. Ways your health care provider can help you practice safe sex  See your health care provider for regular screenings, exams, and tests for STIs. Talk with your health care provider about what kind of birth control (contraception) is best for you. Get vaccinated against hepatitis B and human papillomavirus (HPV). If you are at risk of being infected with HIV (human immunodeficiency virus), talk with your health care provider about taking a prescription medicine to prevent HIV infection. You are at risk for HIV if you: Are a man who has sex with other men. Are  sexually active with more than one partner. Take drugs by injection. Have a sex partner who has HIV. Have unprotected sex. Have sex with someone who has sex with both men and women. Have had an STI. Follow these instructions at home: Take over-the-counter and prescription medicines only as told by your health care provider. Keep all follow-up visits. This is important. Where to find more information Centers for Disease Control and Prevention: www.cdc.gov Planned Parenthood: www.plannedparenthood.org Office on Women's Health: www.womenshealth.gov Summary Practicing safe sex means taking steps before and during sex to reduce your risk getting an STI, giving your partner an STI, and having an unwanted or unplanned pregnancy. Before having sex with a new partner, talk to your partner about past partners, past STIs, and drug use. Use a condom every time you have vaginal, oral, or anal sex. Both females and males should wear condoms during oral sex. Check your body regularly for sores, blisters, rashes, or unusual discharge. If you notice any of these problems, visit your health care provider. See your health care provider for regular screenings, exams, and tests for STIs. This information is not intended to replace advice given to you by your health care provider. Make sure you discuss any questions you have with your health care provider. Document Revised: 09/16/2019 Document Reviewed: 09/16/2019 Elsevier Patient Education  2022 Elsevier Inc.  

## 2021-04-02 ENCOUNTER — Other Ambulatory Visit: Payer: Self-pay | Admitting: Family

## 2021-04-02 DIAGNOSIS — B3731 Acute candidiasis of vulva and vagina: Secondary | ICD-10-CM | POA: Insufficient documentation

## 2021-04-02 DIAGNOSIS — B9689 Other specified bacterial agents as the cause of diseases classified elsewhere: Secondary | ICD-10-CM

## 2021-04-02 DIAGNOSIS — N760 Acute vaginitis: Secondary | ICD-10-CM

## 2021-04-02 LAB — CERVICOVAGINAL ANCILLARY ONLY
Bacterial Vaginitis (gardnerella): POSITIVE — AB
Candida Glabrata: NEGATIVE
Candida Vaginitis: POSITIVE — AB
Chlamydia: NEGATIVE
Comment: NEGATIVE
Comment: NEGATIVE
Comment: NEGATIVE
Comment: NEGATIVE
Comment: NEGATIVE
Comment: NORMAL
Neisseria Gonorrhea: NEGATIVE
Trichomonas: NEGATIVE

## 2021-04-02 LAB — RPR: RPR Ser Ql: NONREACTIVE

## 2021-04-02 LAB — HIV ANTIBODY (ROUTINE TESTING W REFLEX): HIV Screen 4th Generation wRfx: NONREACTIVE

## 2021-04-02 MED ORDER — FLUCONAZOLE 150 MG PO TABS: 150.0000 mg | ORAL_TABLET | Freq: Once | ORAL | 0 refills | Status: AC

## 2021-04-02 MED ORDER — METRONIDAZOLE 500 MG PO TABS: 500.0000 mg | ORAL_TABLET | Freq: Two times a day (BID) | ORAL | 0 refills | Status: AC

## 2021-04-02 NOTE — Progress Notes (Signed)
Gonorrhea, Chlamydia, and Trichomonas negative.   Positive for Bacterial Vaginitis, an overgrowth of normal bacteria in the vagina. Prescribed Metronidazole (Flagyl) twice per day for 7 days. Do not drink alcohol while taking this medication as this may cause severe nausea, vomiting, and upset stomach.   Positive for Candida Vaginitis which is better known as a yeast infection. You have been prescribed Fluconazole (Diflucan) 150 mg (1 tablet) for a one time dose to treat this.

## 2021-04-02 NOTE — Progress Notes (Signed)
Syphilis negative.   HIV negative.

## 2021-04-06 ENCOUNTER — Other Ambulatory Visit: Payer: Self-pay | Admitting: Family

## 2021-04-06 DIAGNOSIS — B9689 Other specified bacterial agents as the cause of diseases classified elsewhere: Secondary | ICD-10-CM

## 2021-04-06 DIAGNOSIS — N760 Acute vaginitis: Secondary | ICD-10-CM

## 2021-05-24 ENCOUNTER — Ambulatory Visit: Payer: Medicaid Other | Admitting: Family

## 2022-02-11 NOTE — Progress Notes (Deleted)
Patient ID: Brooke Gregory, female    DOB: Apr 20, 1991  MRN: [id number]  CC: Annual Physical Exam  Subjective: Brooke Gregory is a 31 y.o. female who presents for annual physical exam.   Her concerns today include:  Pap   Patient Active Problem List   Diagnosis Date Noted   Candida vaginitis 04/02/2021   Bacterial vaginitis 07/22/2020   Gestational diabetes 04/10/2018     No current outpatient medications on file prior to visit.   No current facility-administered medications on file prior to visit.    No Known Allergies  Social History   Socioeconomic History   Marital status: Single    Spouse name: Not on file   Number of children: Not on file   Years of education: Not on file   Highest education level: Not on file  Occupational History   Not on file  Tobacco Use   Smoking status: Never   Smokeless tobacco: Never  Vaping Use   Vaping Use: Never used  Substance and Sexual Activity   Alcohol use: Never   Drug use: Never   Sexual activity: Not Currently    Partners: Male  Other Topics Concern   Not on file  Social History Narrative   Not on file   Social Determinants of Health   Financial Resource Strain: Low Risk  (05/16/2018)   Overall Financial Resource Strain (CARDIA)    Difficulty of Paying Living Expenses: Not hard at all  Food Insecurity: No Food Insecurity (05/16/2018)   Hunger Vital Sign    Worried About Running Out of Food in the Last Year: Never true    [place] in the Last Year: Never true  Transportation Needs: Unknown (05/16/2018)   PRAPARE - Hydrologist (Medical): No    Lack of Transportation (Non-Medical): Not on file  Physical Activity: Not on file  Stress: No Stress Concern Present (05/16/2018)   [place]    Feeling of Stress : Only a little  Social Connections: Not on file  Intimate Partner Violence: Not At Risk (05/16/2018)    Humiliation, Afraid, Rape, and Kick questionnaire    Fear of Current or Ex-Partner: No    Emotionally Abused: No    Physically Abused: No    Sexually Abused: No    Family History  Problem Relation Age of Onset   Hypertension Mother    Diabetes Mother    Kidney disease Maternal Grandmother     Past Surgical History:  Procedure Laterality Date   CESAREAN SECTION     CESAREAN SECTION WITH BILATERAL TUBAL LIGATION Bilateral 05/26/2018   Procedure: CESAREAN SECTION WITH BILATERAL TUBAL LIGATION;  Surgeon: Mora Bellman, MD;  Location: [facility];  Service: Obstetrics;  Laterality: Bilateral;   TUBAL LIGATION      ROS: Review of Systems Negative except as stated above  PHYSICAL EXAM: There were no vitals taken for this visit.  Physical Exam  {female adult master:310786} {female adult master:310785}     Latest Ref Rng & Units 08/17/2020   10:39 AM 05/26/2018    1:24 PM  CMP  Glucose 65 - 99 mg/dL 83    BUN 6 - 20 mg/dL 9    Creatinine 0.57 - 1.00 mg/dL 0.90  0.49   Sodium 134 - 144 mmol/L 139    Potassium 3.5 - 5.2 mmol/L 4.1    Chloride 96 - 106 mmol/L 100  CO2 20 - 29 mmol/L 23    Calcium 8.7 - 10.2 mg/dL 9.5    Total Protein 6.0 - 8.5 g/dL 7.8    Total Bilirubin 0.0 - 1.2 mg/dL 0.4    Alkaline Phos 44 - 121 IU/L 61    AST 0 - 40 IU/L 14    ALT 0 - 32 IU/L 12     Lipid Panel     Component Value Date/Time   CHOL 172 08/17/2020 1039   TRIG 65 08/17/2020 1039   HDL 39 (L) 08/17/2020 1039   CHOLHDL 4.4 08/17/2020 1039   LDLCALC 120 (H) 08/17/2020 1039    CBC    Component Value Date/Time   WBC 6.5 08/17/2020 1039   WBC 14.1 (H) 05/27/2018 0912   RBC 4.51 08/17/2020 1039   RBC 3.24 (L) 05/27/2018 0912   HGB 11.3 08/17/2020 1039   HCT 36.3 08/17/2020 1039   PLT 443 08/17/2020 1039   MCV 81 08/17/2020 1039   MCH 25.1 (L) 08/17/2020 1039   MCH 25.9 (L) 05/27/2018 0912   MCHC 31.1 (L) 08/17/2020 1039   MCHC 32.2 05/27/2018 0912   RDW 14.4 08/17/2020  1039   LYMPHSABS 2.2 12/26/2017 1159   EOSABS 0.2 12/26/2017 1159   BASOSABS 0.0 12/26/2017 1159    ASSESSMENT AND PLAN:  There are no diagnoses linked to this encounter.   Patient was given the opportunity to ask questions.  Patient verbalized understanding of the plan and was able to repeat key elements of the plan. Patient was given clear instructions to go to Emergency Department or return to medical center if symptoms don't improve, worsen, or new problems develop.The patient verbalized understanding.   No orders of the defined types were placed in this encounter.    Requested Prescriptions    No prescriptions requested or ordered in this encounter    No follow-ups on file.  [name], NP

## 2022-02-16 ENCOUNTER — Encounter: Payer: Medicaid Other | Admitting: Family

## 2022-02-16 DIAGNOSIS — Z124 Encounter for screening for malignant neoplasm of cervix: Secondary | ICD-10-CM

## 2022-02-16 DIAGNOSIS — Z130 Encounter for screening for diseases of the blood and blood-forming organs and certain disorders involving the immune mechanism: Secondary | ICD-10-CM

## 2022-02-16 DIAGNOSIS — Z113 Encounter for screening for infections with a predominantly sexual mode of transmission: Secondary | ICD-10-CM

## 2022-02-16 DIAGNOSIS — Z0000 Encounter for general adult medical examination without abnormal findings: Secondary | ICD-10-CM

## 2022-02-16 DIAGNOSIS — Z1329 Encounter for screening for other suspected endocrine disorder: Secondary | ICD-10-CM

## 2022-02-16 DIAGNOSIS — Z13228 Encounter for screening for other metabolic disorders: Secondary | ICD-10-CM

## 2022-02-16 DIAGNOSIS — Z13220 Encounter for screening for lipoid disorders: Secondary | ICD-10-CM

## 2022-02-16 DIAGNOSIS — Z131 Encounter for screening for diabetes mellitus: Secondary | ICD-10-CM

## 2022-03-30 ENCOUNTER — Encounter: Payer: Medicaid Other | Admitting: Radiology

## 2022-05-24 NOTE — Progress Notes (Unsigned)
Patient ID: MURDIS FLITTON, female    DOB: [birth year]  MRN: [id number]  CC: Annual Physical Exam  Subjective: Brooke Gregory is a 32 y.o. female who presents for annual physical exam.   Her concerns today include:  Pap   Patient Active Problem List   Diagnosis Date Noted   Candida vaginitis 04/02/2021   Bacterial vaginitis 07/22/2020   Gestational diabetes 04/10/2018     No current outpatient medications on file prior to visit.   No current facility-administered medications on file prior to visit.    No Known Allergies  Social History   Socioeconomic History   Marital status: Single    Spouse name: Not on file   Number of children: Not on file   Years of education: Not on file   Highest education level: Not on file  Occupational History   Not on file  Tobacco Use   Smoking status: Never   Smokeless tobacco: Never  Vaping Use   Vaping Use: Never used  Substance and Sexual Activity   Alcohol use: Never   Drug use: Never   Sexual activity: Not Currently    Partners: Male  Other Topics Concern   Not on file  Social History Narrative   Not on file   Social Determinants of Health   Financial Resource Strain: Low Risk  (05/16/2018)   Overall Financial Resource Strain (CARDIA)    Difficulty of Paying Living Expenses: Not hard at all  Food Insecurity: No Food Insecurity (05/16/2018)   Hunger Vital Sign    Worried About Running Out of Food in the Last Year: Never true    [place] in the Last Year: Never true  Transportation Needs: Unknown (05/16/2018)   PRAPARE - Hydrologist (Medical): No    Lack of Transportation (Non-Medical): Not on file  Physical Activity: Not on file  Stress: No Stress Concern Present (05/16/2018)   [place]    Feeling of Stress : Only a little  Social Connections: Not on file  Intimate Partner Violence: Not At Risk (05/16/2018)    Humiliation, Afraid, Rape, and Kick questionnaire    Fear of Current or Ex-Partner: No    Emotionally Abused: No    Physically Abused: No    Sexually Abused: No    Family History  Problem Relation Age of Onset   Hypertension Mother    Diabetes Mother    Kidney disease Maternal Grandmother     Past Surgical History:  Procedure Laterality Date   CESAREAN SECTION     CESAREAN SECTION WITH BILATERAL TUBAL LIGATION Bilateral 05/26/2018   Procedure: CESAREAN SECTION WITH BILATERAL TUBAL LIGATION;  Surgeon: Mora Bellman, MD;  Location: [facility];  Service: Obstetrics;  Laterality: Bilateral;   TUBAL LIGATION      ROS: Review of Systems Negative except as stated above  PHYSICAL EXAM: There were no vitals taken for this visit.  Physical Exam  {female adult master:310786} {female adult master:310785}     Latest Ref Rng & Units 08/17/2020   10:39 AM 05/26/2018    1:24 PM  CMP  Glucose 65 - 99 mg/dL 83    BUN 6 - 20 mg/dL 9    Creatinine 0.57 - 1.00 mg/dL 0.90  0.49   Sodium 134 - 144 mmol/L 139    Potassium 3.5 - 5.2 mmol/L 4.1    Chloride 96 - 106 mmol/L 100  CO2 20 - 29 mmol/L 23    Calcium 8.7 - 10.2 mg/dL 9.5    Total Protein 6.0 - 8.5 g/dL 7.8    Total Bilirubin 0.0 - 1.2 mg/dL 0.4    Alkaline Phos 44 - 121 IU/L 61    AST 0 - 40 IU/L 14    ALT 0 - 32 IU/L 12     Lipid Panel     Component Value Date/Time   CHOL 172 08/17/2020 1039   TRIG 65 08/17/2020 1039   HDL 39 (L) 08/17/2020 1039   CHOLHDL 4.4 08/17/2020 1039   LDLCALC 120 (H) 08/17/2020 1039    CBC    Component Value Date/Time   WBC 6.5 08/17/2020 1039   WBC 14.1 (H) 05/27/2018 0912   RBC 4.51 08/17/2020 1039   RBC 3.24 (L) 05/27/2018 0912   HGB 11.3 08/17/2020 1039   HCT 36.3 08/17/2020 1039   PLT 443 08/17/2020 1039   MCV 81 08/17/2020 1039   MCH 25.1 (L) 08/17/2020 1039   MCH 25.9 (L) 05/27/2018 0912   MCHC 31.1 (L) 08/17/2020 1039   MCHC 32.2 05/27/2018 0912   RDW 14.4 08/17/2020  1039   LYMPHSABS 2.2 12/26/2017 1159   EOSABS 0.2 12/26/2017 1159   BASOSABS 0.0 12/26/2017 1159    ASSESSMENT AND PLAN:  There are no diagnoses linked to this encounter.   Patient was given the opportunity to ask questions.  Patient verbalized understanding of the plan and was able to repeat key elements of the plan. Patient was given clear instructions to go to Emergency Department or return to medical center if symptoms don't improve, worsen, or new problems develop.The patient verbalized understanding.   No orders of the defined types were placed in this encounter.    Requested Prescriptions    No prescriptions requested or ordered in this encounter    No follow-ups on file.  [name], NP

## 2022-05-25 ENCOUNTER — Encounter: Payer: Self-pay | Admitting: Family

## 2022-05-25 ENCOUNTER — Ambulatory Visit (INDEPENDENT_AMBULATORY_CARE_PROVIDER_SITE_OTHER): Payer: Medicaid Other | Admitting: Family

## 2022-05-25 ENCOUNTER — Other Ambulatory Visit: Payer: Self-pay | Admitting: Family

## 2022-05-25 ENCOUNTER — Other Ambulatory Visit (HOSPITAL_COMMUNITY)
Admission: RE | Admit: 2022-05-25 | Discharge: 2022-05-25 | Disposition: A | Discharge status: Home / Self Care | Payer: Medicaid Other | Source: Ambulatory Visit | Attending: Family | Admitting: Family

## 2022-05-25 VITALS — BP 130/89 | HR 73 | Ht 61.0 in | Wt 193.2 lb

## 2022-05-25 DIAGNOSIS — Z0001 Encounter for general adult medical examination with abnormal findings: Secondary | ICD-10-CM | POA: Diagnosis not present

## 2022-05-25 DIAGNOSIS — R8290 Unspecified abnormal findings in urine: Secondary | ICD-10-CM

## 2022-05-25 DIAGNOSIS — Z13220 Encounter for screening for lipoid disorders: Secondary | ICD-10-CM

## 2022-05-25 DIAGNOSIS — Z0000 Encounter for general adult medical examination without abnormal findings: Secondary | ICD-10-CM

## 2022-05-25 DIAGNOSIS — Z130 Encounter for screening for diseases of the blood and blood-forming organs and certain disorders involving the immune mechanism: Secondary | ICD-10-CM

## 2022-05-25 DIAGNOSIS — Z113 Encounter for screening for infections with a predominantly sexual mode of transmission: Secondary | ICD-10-CM

## 2022-05-25 DIAGNOSIS — Z1329 Encounter for screening for other suspected endocrine disorder: Secondary | ICD-10-CM | POA: Diagnosis not present

## 2022-05-25 DIAGNOSIS — Z131 Encounter for screening for diabetes mellitus: Secondary | ICD-10-CM

## 2022-05-25 DIAGNOSIS — Z13228 Encounter for screening for other metabolic disorders: Secondary | ICD-10-CM | POA: Diagnosis not present

## 2022-05-25 DIAGNOSIS — Z124 Encounter for screening for malignant neoplasm of cervix: Secondary | ICD-10-CM | POA: Insufficient documentation

## 2022-05-25 DIAGNOSIS — N3001 Acute cystitis with hematuria: Secondary | ICD-10-CM

## 2022-05-25 LAB — POCT URINALYSIS DIP (CLINITEK)
Bilirubin, UA: NEGATIVE
Glucose, UA: NEGATIVE mg/dL
Ketones, POC UA: NEGATIVE mg/dL
Leukocytes, UA: NEGATIVE
Nitrite, UA: POSITIVE — AB
POC PROTEIN,UA: NEGATIVE
Spec Grav, UA: 1.025 (ref 1.010–1.025)
Urobilinogen, UA: 0.2 E.U./dL
pH, UA: 7 (ref 5.0–8.0)

## 2022-05-25 MED ORDER — NITROFURANTOIN MONOHYD MACRO 100 MG PO CAPS: 100.0000 mg | ORAL_CAPSULE | Freq: Two times a day (BID) | ORAL | 0 refills | Status: AC

## 2022-05-25 NOTE — Patient Instructions (Signed)

## 2022-05-25 NOTE — Addendum Note (Signed)
Addended by: [name] on: 05/25/2022 10:55 AM   Modules accepted: Orders

## 2022-05-25 NOTE — Addendum Note (Signed)
Addended by: Suszanne Finch L on: 05/25/2022 11:09 AM   Modules accepted: Orders

## 2022-05-26 ENCOUNTER — Encounter: Payer: Self-pay | Admitting: Family

## 2022-05-26 DIAGNOSIS — R7303 Prediabetes: Secondary | ICD-10-CM | POA: Insufficient documentation

## 2022-05-26 LAB — HEMOGLOBIN A1C
Est. average glucose Bld gHb Est-mCnc: 117 mg/dL
Hgb A1c MFr Bld: 5.7 % — ABNORMAL HIGH (ref 4.8–5.6)

## 2022-05-26 LAB — CMP14+EGFR
ALT: 17 IU/L (ref 0–32)
AST: 16 IU/L (ref 0–40)
Albumin/Globulin Ratio: 1.3 (ref 1.2–2.2)
Albumin: 4 g/dL (ref 3.9–4.9)
Alkaline Phosphatase: 62 IU/L (ref 44–121)
BUN/Creatinine Ratio: 12 (ref 9–23)
BUN: 10 mg/dL (ref 6–20)
Bilirubin Total: <0.2 mg/dL (ref 0.0–1.2)
CO2: 20 mmol/L (ref 20–29)
Calcium: 9.1 mg/dL (ref 8.7–10.2)
Chloride: 104 mmol/L (ref 96–106)
Creatinine, Ser: 0.85 mg/dL (ref 0.57–1.00)
Globulin, Total: 3 g/dL (ref 1.5–4.5)
Glucose: 78 mg/dL (ref 70–99)
Potassium: 4.1 mmol/L (ref 3.5–5.2)
Sodium: 139 mmol/L (ref 134–144)
Total Protein: 7 g/dL (ref 6.0–8.5)
eGFR: 93 mL/min/{1.73_m2} (ref >59)

## 2022-05-26 LAB — CERVICOVAGINAL ANCILLARY ONLY
Bacterial Vaginitis (gardnerella): NEGATIVE
Candida Glabrata: NEGATIVE
Candida Vaginitis: NEGATIVE
Chlamydia: NEGATIVE
Comment: NEGATIVE
Comment: NEGATIVE
Comment: NEGATIVE
Comment: NEGATIVE
Comment: NEGATIVE
Comment: NORMAL
Neisseria Gonorrhea: NEGATIVE
Trichomonas: NEGATIVE

## 2022-05-26 LAB — CBC
Hematocrit: 37.7 % (ref 34.0–46.6)
Hemoglobin: 12.3 g/dL (ref 11.1–15.9)
MCH: 27.6 pg (ref 26.6–33.0)
MCHC: 32.6 g/dL (ref 31.5–35.7)
MCV: 85 fL (ref 79–97)
Platelets: 403 10*3/uL (ref 150–450)
RBC: 4.45 x10E6/uL (ref 3.77–5.28)
RDW: 14 % (ref 11.7–15.4)
WBC: 7.3 10*3/uL (ref 3.4–10.8)

## 2022-05-26 LAB — TSH: TSH: 0.826 u[IU]/mL (ref 0.450–4.500)

## 2022-05-26 LAB — LIPID PANEL
Chol/HDL Ratio: 3.7 ratio (ref 0.0–4.4)
Cholesterol, Total: 146 mg/dL (ref 100–199)
HDL: 39 mg/dL — ABNORMAL LOW (ref >39)
LDL Chol Calc (NIH): 86 mg/dL (ref 0–99)
Triglycerides: 118 mg/dL (ref 0–149)
VLDL Cholesterol Cal: 21 mg/dL (ref 5–40)

## 2022-05-27 LAB — CYTOLOGY - PAP
Adequacy: ABSENT
Comment: NEGATIVE
Diagnosis: NEGATIVE
High risk HPV: NEGATIVE

## 2022-07-20 ENCOUNTER — Encounter: Payer: Self-pay | Admitting: Family

## 2022-09-23 ENCOUNTER — Telehealth: Payer: Self-pay | Admitting: Family

## 2022-09-23 NOTE — Telephone Encounter (Signed)
Pt returned cb to schedule tier vaccine. Please call back

## 2022-09-23 NOTE — Telephone Encounter (Signed)
Called pt to schedule for Titer testing, left a voicemail. Also replied back on MyChart to call back office so we can schedule her.

## 2023-01-17 ENCOUNTER — Ambulatory Visit (INDEPENDENT_AMBULATORY_CARE_PROVIDER_SITE_OTHER): Payer: Medicaid Other | Admitting: Family Medicine

## 2023-01-17 ENCOUNTER — Encounter: Payer: Self-pay | Admitting: Family Medicine

## 2023-01-17 VITALS — BP 138/98 | HR 79 | Temp 98.1°F | Resp 16 | Wt 198.0 lb

## 2023-01-17 DIAGNOSIS — R21 Rash and other nonspecific skin eruption: Secondary | ICD-10-CM

## 2023-01-17 MED ORDER — TRIAMCINOLONE ACETONIDE 0.1 % EX CREA: 1.0000 | TOPICAL_CREAM | Freq: Two times a day (BID) | CUTANEOUS | 0 refills | Status: AC

## 2023-01-18 ENCOUNTER — Encounter: Payer: Self-pay | Admitting: Family Medicine

## 2023-01-18 NOTE — Progress Notes (Signed)
Established Patient Office Visit  Subjective    Patient ID: Brooke Gregory, female    DOB: 1990-07-12  Age: 32 y.o. MRN: [id number]  CC:  Chief Complaint  Patient presents with   Rash    Patient concern about rash on the back of her neck for weeks.    HPI Brooke Gregory presents for complaint of rash on backof neck. She reports sx since trying some new "hair"  Outpatient Encounter Medications as of 01/17/2023  Medication Sig   triamcinolone cream (KENALOG) 0.1 % Apply 1 Application topically 2 (two) times daily.   No facility-administered encounter medications on file as of 01/17/2023.    Past Medical History:  Diagnosis Date   Gestational diabetes     Past Surgical History:  Procedure Laterality Date   CESAREAN SECTION     CESAREAN SECTION WITH BILATERAL TUBAL LIGATION Bilateral 05/26/2018   Procedure: CESAREAN SECTION WITH BILATERAL TUBAL LIGATION;  Surgeon: Catalina Antigua, MD;  [facility];  Service: Obstetrics;  Laterality: Bilateral;   TUBAL LIGATION      Family History  Problem Relation Age of Onset   Hypertension Mother    Diabetes Mother    Kidney disease Maternal Grandmother     Social History   Socioeconomic History   Marital status: Single    Spouse name: Not on file   Number of children: Not on file   Years of education: Not on file   Highest education level: Not on file  Occupational History   Not on file  Tobacco Use   Smoking status: Never   Smokeless tobacco: Never  Vaping Use   Vaping status: Never Used  Substance and Sexual Activity   Alcohol use: Never   Drug use: Never   Sexual activity: Not Currently    Partners: Male  Other Topics Concern   Not on file  Social History Narrative   Not on file   Social Determinants of Health   Financial Resource Strain: Medium Risk (01/17/2023)   Overall Financial Resource Strain (CARDIA)    Difficulty of Paying Living Expenses: Somewhat hard  Food Insecurity: No Food  Insecurity (01/17/2023)   Hunger Vital Sign    Worried About Running Out of Food in the Last Year: Never true    Ran Out of Food in the Last Year: Never true  Transportation Needs: Unknown (05/16/2018)   PRAPARE - Administrator, Civil Service (Medical): No    Lack of Transportation (Non-Medical): Not on file  Physical Activity: Inactive (01/17/2023)   Exercise Vital Sign    Days of Exercise per Week: 0 days    Minutes of Exercise per Session: 0 min  Stress: No Stress Concern Present (01/17/2023)   [employer] of Occupational Health - Occupational Stress Questionnaire    Feeling of Stress : Only a little  Social Connections: Socially Integrated (01/17/2023)   Social Connection and Isolation Panel [NHANES]    Frequency of Communication with Friends and Family: More than three times a week    Frequency of Social Gatherings with Friends and Family: More than three times a week    Attends Religious Services: More than 4 times per year    Active Member of [employer] or Organizations: Not on file    Attends Banker Meetings: More than 4 times per year    Marital Status: Living with partner  Intimate Partner Violence: Not At Risk (01/17/2023)   Humiliation, Afraid, Rape, and Kick questionnaire  Fear of Current or Ex-Partner: No    Emotionally Abused: No    Physically Abused: No    Sexually Abused: No    Review of Systems  Skin:  Positive for itching and rash.        Objective    BP (!) 138/98   Pulse 79   Temp 98.1 F (36.7 C) (Oral)   Resp 16   Wt 198 lb (89.8 kg)   SpO2 98%   BMI 37.41 kg/m   Physical Exam Vitals and nursing note reviewed.  Constitutional:      General: She is not in acute distress. Cardiovascular:     Rate and Rhythm: Normal rate and regular rhythm.  Pulmonary:     Effort: Pulmonary effort is normal.     Breath sounds: Normal breath sounds.  Skin:    Findings: Rash present.  Neurological:     General: No focal deficit  present.     Mental Status: She is alert and oriented to person, place, and time.         Assessment & Plan:   Rash and nonspecific skin eruption  Other orders -     Triamcinolone Acetonide; Apply 1 Application topically 2 (two) times daily.  Dispense: 45 g; Refill: 0     Return if symptoms worsen or fail to improve.   Tommie Raymond, MD

## 2023-03-22 ENCOUNTER — Ambulatory Visit
Admission: EM | Admit: 2023-03-22 | Discharge: 2023-03-22 | Disposition: A | Discharge status: Home / Self Care | Payer: Medicaid Other

## 2023-03-22 ENCOUNTER — Encounter: Payer: Self-pay | Admitting: Emergency Medicine

## 2023-03-22 ENCOUNTER — Other Ambulatory Visit: Payer: Self-pay

## 2023-03-22 DIAGNOSIS — K047 Periapical abscess without sinus: Secondary | ICD-10-CM | POA: Diagnosis not present

## 2023-03-22 MED ORDER — AMOXICILLIN 500 MG PO CAPS: 500.0000 mg | ORAL_CAPSULE | Freq: Three times a day (TID) | ORAL | 0 refills | Status: AC

## 2023-03-22 NOTE — ED Triage Notes (Signed)
Pt reports tooth pain to L upper side  x1 week. Worsening pain over night not relived with OTC meds.

## 2023-03-22 NOTE — ED Provider Notes (Signed)
[facility]    CSN: [id number] Arrival date & time: 03/22/23  0849      History   Chief Complaint Chief Complaint  Patient presents with   Dental Pain    HPI Brooke Gregory is a 32 y.o. female.    Dental Pain Associated symptoms: no fever     Past Medical History:  Diagnosis Date   Gestational diabetes     Patient Active Problem List   Diagnosis Date Noted   Prediabetes 05/26/2022   Candida vaginitis 04/02/2021   Bacterial vaginitis 07/22/2020   Gestational diabetes 04/10/2018    Past Surgical History:  Procedure Laterality Date   CESAREAN SECTION     CESAREAN SECTION WITH BILATERAL TUBAL LIGATION Bilateral 05/26/2018   Procedure: CESAREAN SECTION WITH BILATERAL TUBAL LIGATION;  Surgeon: Catalina Antigua, MD;  Location: [facility];  Service: Obstetrics;  Laterality: Bilateral;   TUBAL LIGATION      OB History     Gravida  3   Para  3   Term  3   Preterm      AB      Living  3      SAB      IAB      Ectopic      Multiple  0   Live Births  3            Home Medications    Prior to Admission medications   Medication Sig Start Date End Date Taking? Authorizing Provider  amoxicillin (AMOXIL) 500 MG capsule Take 1 capsule (500 mg total) by mouth 3 (three) times daily. 03/22/23  Yes Tomi Bamberger, PA-C  ibuprofen (ADVIL) 200 MG tablet Take 200 mg by mouth every 6 (six) hours as needed.   Yes [provider]  triamcinolone cream (KENALOG) 0.1 % Apply 1 Application topically 2 (two) times daily. Patient not taking: Reported on 03/22/2023 01/17/23   Georganna Skeans, MD    Family History Family History  Problem Relation Age of Onset   Hypertension Mother    Diabetes Mother    Kidney disease Maternal Grandmother     Social History Social History   Tobacco Use   Smoking status: Never   Smokeless tobacco: Never  Vaping Use   Vaping status: Never Used  Substance Use Topics   Alcohol use: Never    Drug use: Never     Allergies   Patient has no known allergies.   Review of Systems Review of Systems  Constitutional:  Negative for chills and fever.  HENT:  Positive for dental problem.   Eyes:  Negative for discharge and redness.  Respiratory:  Negative for shortness of breath.   Gastrointestinal:  Negative for abdominal pain, nausea and vomiting.     Physical Exam Triage Vital Signs ED Triage Vitals [03/22/23 0904]  Encounter Vitals Group     BP (!) 150/100     Systolic BP Percentile      Diastolic BP Percentile      Pulse Rate 84     Resp 16     Temp 99.1 F (37.3 C)     Temp Source Oral     SpO2 97 %     Weight      Height      Head Circumference      Peak Flow      Pain Score 7     Pain Loc      Pain Education  Exclude from Growth Chart    No data found.  Updated Vital Signs BP (!) 141/93 (BP Location: Left Arm)   Pulse 84   Temp 99.1 F (37.3 C) (Oral)   Resp 16   LMP 01/24/2023   SpO2 97%   Visual Acuity Right Eye Distance:   Left Eye Distance:   Bilateral Distance:    Right Eye Near:   Left Eye Near:    Bilateral Near:     Physical Exam Vitals and nursing note reviewed.  Constitutional:      General: She is not in acute distress.    Appearance: Normal appearance. She is not ill-appearing.  HENT:     Head: Normocephalic and atraumatic.     Mouth/Throat:     Comments: Gingival swelling noted to left upper molars Eyes:     Conjunctiva/sclera: Conjunctivae normal.  Cardiovascular:     Rate and Rhythm: Normal rate.  Pulmonary:     Effort: Pulmonary effort is normal.  Neurological:     Mental Status: She is alert.  Psychiatric:        Mood and Affect: Mood normal.        Behavior: Behavior normal.        Thought Content: Thought content normal.      UC Treatments / Results  Labs (all labs ordered are listed, but only abnormal results are displayed) Labs Reviewed - No data to display  EKG   Radiology No results  found.  Procedures Procedures (including critical care time)  Medications Ordered in UC Medications - No data to display  Initial Impression / Assessment and Plan / UC Course  I have reviewed the triage vital signs and the nursing notes.  Pertinent labs & imaging results that were available during my care of the patient were reviewed by me and considered in my medical decision making (see chart for details).    Will treat to cover possible abscess with amoxicillin.  Recommended follow-up with dentistry as soon as possible.  Advised ibuprofen if needed for pain relief.  Final Clinical Impressions(s) / UC Diagnoses   Final diagnoses:  Dental abscess   Discharge Instructions   None    ED Prescriptions     Medication Sig Dispense Auth. Provider   amoxicillin (AMOXIL) 500 MG capsule Take 1 capsule (500 mg total) by mouth 3 (three) times daily. 21 capsule Tomi Bamberger, PA-C      PDMP not reviewed this encounter.   Tomi Bamberger, PA-C 03/22/23 [phone]

## 2023-05-29 ENCOUNTER — Encounter: Payer: Medicaid Other | Admitting: Family

## 2023-06-12 ENCOUNTER — Encounter (HOSPITAL_COMMUNITY): Payer: Self-pay

## 2023-06-12 ENCOUNTER — Other Ambulatory Visit: Payer: Self-pay

## 2023-06-12 ENCOUNTER — Emergency Department (HOSPITAL_COMMUNITY)
Admission: EM | Admit: 2023-06-12 | Discharge: 2023-06-13 | Disposition: A | Discharge status: Home / Self Care | Payer: Medicaid Other | Attending: Emergency Medicine | Admitting: Emergency Medicine

## 2023-06-12 DIAGNOSIS — K029 Dental caries, unspecified: Secondary | ICD-10-CM | POA: Insufficient documentation

## 2023-06-12 DIAGNOSIS — K0889 Other specified disorders of teeth and supporting structures: Secondary | ICD-10-CM | POA: Diagnosis present

## 2023-06-12 LAB — CBC
HCT: 37 % (ref 36.0–46.0)
Hemoglobin: 12 g/dL (ref 12.0–15.0)
MCH: 27.4 pg (ref 26.0–34.0)
MCHC: 32.4 g/dL (ref 30.0–36.0)
MCV: 84.5 fL (ref 80.0–100.0)
Platelets: 396 10*3/uL (ref 150–400)
RBC: 4.38 MIL/uL (ref 3.87–5.11)
RDW: 13.2 % (ref 11.5–15.5)
WBC: 13.5 10*3/uL — ABNORMAL HIGH (ref 4.0–10.5)
nRBC: 0 % (ref 0.0–0.2)

## 2023-06-12 LAB — BASIC METABOLIC PANEL
Anion gap: 10 (ref 5–15)
BUN: 9 mg/dL (ref 6–20)
CO2: 26 mmol/L (ref 22–32)
Calcium: 9.2 mg/dL (ref 8.9–10.3)
Chloride: 103 mmol/L (ref 98–111)
Creatinine, Ser: 0.84 mg/dL (ref 0.44–1.00)
GFR, Estimated: >60 mL/min (ref >60)
Glucose, Bld: 122 mg/dL — ABNORMAL HIGH (ref 70–99)
Potassium: 3.9 mmol/L (ref 3.5–5.1)
Sodium: 139 mmol/L (ref 135–145)

## 2023-06-12 LAB — HCG, SERUM, QUALITATIVE: Preg, Serum: NEGATIVE

## 2023-06-12 MED ORDER — HYDROCODONE-ACETAMINOPHEN 5-325 MG PO TABS
1.0000 | ORAL_TABLET | Freq: Once | ORAL | Status: AC
  Administered 2023-06-12: 1 via ORAL
  Filled 2023-06-12: qty 1

## 2023-06-12 NOTE — ED Provider Triage Note (Signed)
Emergency Medicine Provider Triage Evaluation Note  Brooke Gregory , a 33 y.o. female  was evaluated in triage.  Pt complains of dental pain. Started in December.  Located in the back left upper teeth.  States she has a dental fracture in that area.  Saw urgent care in December and was prescribed antibiotics and advised to go to the dentist.  She took the antibiotics and started to feel better and did not go to the dentist.  States the pain has since returned in the last week and now radiating to the left neck and up the left jaw.  Review of Systems  Positive: See above Negative: See above  Physical Exam  BP (!) 155/117 (BP Location: Left Arm)   Pulse 100   Temp 98.7 F (37.1 C) (Oral)   Resp 16   Wt 89.8 kg   SpO2 100%   BMI 37.41 kg/m  Gen:   Awake, no distress   Resp:  Normal effort  MSK:   Moves extremities without difficulty  Other:    Medical Decision Making  Medically screening exam initiated at 9:40 PM.  Appropriate orders placed.  Katherine Mantle was informed that the remainder of the evaluation will be completed by another provider, this initial triage assessment does not replace that evaluation, and the importance of remaining in the ED until their evaluation is complete.  Work up started   Gareth Eagle, [place] 06/12/23 2141

## 2023-06-12 NOTE — ED Triage Notes (Signed)
Pt arrives with c/o left sided dental pain that has been going on since December. Pt denies fevers.

## 2023-06-13 MED ORDER — AMOXICILLIN-POT CLAVULANATE 875-125 MG PO TABS
1.0000 | ORAL_TABLET | Freq: Once | ORAL | Status: AC
  Administered 2023-06-13: 1 via ORAL
  Filled 2023-06-13: qty 1

## 2023-06-13 MED ORDER — OXYCODONE HCL 5 MG PO TABS
5.0000 mg | ORAL_TABLET | Freq: Once | ORAL | Status: AC
  Administered 2023-06-13: 5 mg via ORAL
  Filled 2023-06-13: qty 1

## 2023-06-13 MED ORDER — AMOXICILLIN-POT CLAVULANATE 875-125 MG PO TABS: 1.0000 | ORAL_TABLET | Freq: Two times a day (BID) | ORAL | 0 refills | Status: AC

## 2023-06-13 MED ORDER — IBUPROFEN 600 MG PO TABS: 600.0000 mg | ORAL_TABLET | Freq: Four times a day (QID) | ORAL | 0 refills | Status: AC | PRN

## 2023-06-13 NOTE — ED Notes (Signed)
 Discharge instructions reviewed with patient. Patient questions answered and opportunity for education reviewed. Patient voices understanding of discharge instructions with no further questions. Patient ambulatory with steady gait to lobby.

## 2023-06-13 NOTE — ED Provider Notes (Signed)
[place] EMERGENCY DEPARTMENT AT [facility] Provider Note   CSN: [id number] Arrival date & time: 06/12/23  2114     History  Chief Complaint  Patient presents with   Dental Pain    Brooke Gregory is a 33 y.o. female.  The history is provided by the patient.  Dental Pain Brooke Gregory is a 33 y.o. female who presents to the Emergency Department complaining of dental pain.  She presents to the emergency department for evaluation of left sided dental pain in her left upper molar that started several months ago but has worsened since beginning of February.  She has pain with exposure to the air and pain that radiates throughout the left side of her face.  No fevers, difficulty breathing, difficulty swallowing.  She initially was treated with antibiotics in December for dental infection but her symptoms never fully resolved.  She was unable to follow-up with dentistry at that time.  Overall symptoms are worsening.  She also feels somewhat tight and swollen on the left side.     Home Medications Prior to Admission medications   Medication Sig Start Date End Date Taking? Authorizing Provider  amoxicillin (AMOXIL) 500 MG capsule Take 1 capsule (500 mg total) by mouth 3 (three) times daily. 03/22/23   Tomi Bamberger, PA-C  amoxicillin-clavulanate (AUGMENTIN) 875-125 MG tablet Take 1 tablet by mouth every 12 (twelve) hours. 06/13/23  Yes Tilden Fossa, MD  ibuprofen (ADVIL) 600 MG tablet Take 1 tablet (600 mg total) by mouth every 6 (six) hours as needed. 06/13/23  Yes Tilden Fossa, MD  triamcinolone cream (KENALOG) 0.1 % Apply 1 Application topically 2 (two) times daily. Patient not taking: Reported on 03/22/2023 01/17/23   Georganna Skeans, MD      Allergies    Patient has no known allergies.    Review of Systems   Review of Systems  All other systems reviewed and are negative.   Physical Exam Updated Vital Signs BP (!) 159/110   Pulse 92   Temp 98.8 F (37.1  C) (Oral)   Resp 15   Wt 89.8 kg   SpO2 100%   BMI 37.41 kg/m  Physical Exam Vitals and nursing note reviewed.  Constitutional:      Appearance: She is well-developed.  HENT:     Head: Normocephalic and atraumatic.     Comments: Tooth 16 is carious.  There is no significant soft tissue swelling in the region. Cardiovascular:     Rate and Rhythm: Normal rate and regular rhythm.  Pulmonary:     Effort: Pulmonary effort is normal. No respiratory distress.  Musculoskeletal:        General: No tenderness.     Cervical back: Neck supple.  Lymphadenopathy:     Cervical: Cervical adenopathy present.  Skin:    General: Skin is warm and dry.  Neurological:     Mental Status: She is alert and oriented to person, place, and time.     Comments: No asymmetry of facial movements.  Psychiatric:        Behavior: Behavior normal.     ED Results / Procedures / Treatments   Labs (all labs ordered are listed, but only abnormal results are displayed) Labs Reviewed  BASIC METABOLIC PANEL - Abnormal; Notable for the following components:      Result Value   Glucose, Bld 122 (*)    All other components within normal limits  CBC - Abnormal; Notable for the following components:  WBC 13.5 (*)    All other components within normal limits  HCG, SERUM, QUALITATIVE    EKG None  Radiology No results found.  Procedures Procedures    Medications Ordered in ED Medications  oxyCODONE (Oxy IR/ROXICODONE) immediate release tablet 5 mg (has no administration in time range)  amoxicillin-clavulanate (AUGMENTIN) 875-125 MG per tablet 1 tablet (has no administration in time range)  HYDROcodone-acetaminophen (NORCO/VICODIN) 5-325 MG per tablet 1 tablet (1 tablet Oral Given 06/12/23 2154)    ED Course/ Medical Decision Making/ A&P                                 Medical Decision Making Risk Prescription drug management.   Patient here for evaluation of several months of progressive dental  pain.  On examination she does have a carious tooth, no discrete dental abscess but she does have cervical lymphadenopathy.  Will start on antibiotics for possible early infection.  Will provide information for dentistry follow-up.  CBC with mild leukocytosis.  Discussed OTC analgesics.  Will prescribe ibuprofen.  Discussed outpatient follow-up.        Final Clinical Impression(s) / ED Diagnoses Final diagnoses:  Pain due to dental caries    Rx / DC Orders ED Discharge Orders          Ordered    amoxicillin-clavulanate (AUGMENTIN) 875-125 MG tablet  Every 12 hours        06/13/23 0146    ibuprofen (ADVIL) 600 MG tablet  Every 6 hours PRN        06/13/23 0146              Tilden Fossa, MD 06/13/23 [phone]

## 2023-09-22 ENCOUNTER — Ambulatory Visit (INDEPENDENT_AMBULATORY_CARE_PROVIDER_SITE_OTHER): Admitting: Family

## 2023-09-22 ENCOUNTER — Encounter: Payer: Self-pay | Admitting: Family

## 2023-09-22 VITALS — BP 137/95 | HR 83 | Temp 97.9°F | Resp 16 | Ht 61.0 in | Wt 205.0 lb

## 2023-09-22 DIAGNOSIS — Z13228 Encounter for screening for other metabolic disorders: Secondary | ICD-10-CM | POA: Diagnosis not present

## 2023-09-22 DIAGNOSIS — Z0000 Encounter for general adult medical examination without abnormal findings: Secondary | ICD-10-CM | POA: Diagnosis not present

## 2023-09-22 DIAGNOSIS — Z130 Encounter for screening for diseases of the blood and blood-forming organs and certain disorders involving the immune mechanism: Secondary | ICD-10-CM

## 2023-09-22 DIAGNOSIS — Z131 Encounter for screening for diabetes mellitus: Secondary | ICD-10-CM

## 2023-09-22 DIAGNOSIS — R030 Elevated blood-pressure reading, without diagnosis of hypertension: Secondary | ICD-10-CM

## 2023-09-22 DIAGNOSIS — Z0289 Encounter for other administrative examinations: Secondary | ICD-10-CM

## 2023-09-22 DIAGNOSIS — Z13220 Encounter for screening for lipoid disorders: Secondary | ICD-10-CM

## 2023-09-22 DIAGNOSIS — Z1329 Encounter for screening for other suspected endocrine disorder: Secondary | ICD-10-CM | POA: Diagnosis not present

## 2023-09-22 NOTE — Progress Notes (Signed)
 Patient ID: Brooke Gregory, female    DOB: March 27, 1991  MRN: [id number]  CC: Annual Exam  Subjective: Brooke Gregory is a 33 y.o. female who presents for annual exam.   Her concerns today include:  - Blood pressure check. She does not check blood pressure outside of office. She does not complain of red flag symptoms such as but not limited to chest pain, shortness of breath, worst headache of life, nausea/vomiting.  - Care Gaps up to date. - Patient has a [employer] Physical Examination form for completion.   Patient Active Problem List   Diagnosis Date Noted   Prediabetes 05/26/2022   Candida vaginitis 04/02/2021   Bacterial vaginitis 07/22/2020   Gestational diabetes 04/10/2018     Current Outpatient Medications on File Prior to Visit  Medication Sig Dispense Refill   amoxicillin  (AMOXIL ) 500 MG capsule Take 1 capsule (500 mg total) by mouth 3 (three) times daily. (Patient not taking: Reported on 09/22/2023) 21 capsule 0   amoxicillin -clavulanate (AUGMENTIN ) 875-125 MG tablet Take 1 tablet by mouth every 12 (twelve) hours. (Patient not taking: Reported on 09/22/2023) 14 tablet 0   ibuprofen  (ADVIL ) 600 MG tablet Take 1 tablet (600 mg total) by mouth every 6 (six) hours as needed. (Patient not taking: Reported on 09/22/2023) 30 tablet 0   triamcinolone  cream (KENALOG ) 0.1 % Apply 1 Application topically 2 (two) times daily. (Patient not taking: Reported on 03/22/2023) 45 g 0   No current facility-administered medications on file prior to visit.    No Known Allergies  Social History   Socioeconomic History   Marital status: Single    Spouse name: Not on file   Number of children: Not on file   Years of education: Not on file   Highest education level: Not on file  Occupational History   Not on file  Tobacco Use   Smoking status: Never   Smokeless tobacco: Never  Vaping Use   Vaping status: Never Used  Substance and Sexual Activity   Alcohol use: Never    Drug use: Never   Sexual activity: Not Currently    Partners: Male  Other Topics Concern   Not on file  Social History Narrative   Not on file   Social Drivers of Health   Financial Resource Strain: Medium Risk (01/17/2023)   Overall Financial Resource Strain (CARDIA)    Difficulty of Paying Living Expenses: Somewhat hard  Food Insecurity: No Food Insecurity (01/17/2023)   Hunger Vital Sign    Worried About Running Out of Food in the Last Year: Never true    Ran Out of Food in the Last Year: Never true  Transportation Needs: No Transportation Needs (09/22/2023)   PRAPARE - Administrator, Civil Service (Medical): No    Lack of Transportation (Non-Medical): No  Physical Activity: Inactive (01/17/2023)   Exercise Vital Sign    Days of Exercise per Week: 0 days    Minutes of Exercise per Session: 0 min  Stress: No Stress Concern Present (01/17/2023)   [employer] of Occupational Health - Occupational Stress Questionnaire    Feeling of Stress : Only a little  Social Connections: Socially Integrated (01/17/2023)   Social Connection and Isolation Panel [NHANES]    Frequency of Communication with Friends and Family: More than three times a week    Frequency of Social Gatherings with Friends and Family: More than three times a week    Attends Religious Services: More than 4  times per year    Active Member of Clubs or Organizations: Not on file    Attends Club or Organization Meetings: More than 4 times per year    Marital Status: Living with partner  Intimate Partner Violence: Not At Risk (01/17/2023)   Humiliation, Afraid, Rape, and Kick questionnaire    Fear of Current or Ex-Partner: No    Emotionally Abused: No    Physically Abused: No    Sexually Abused: No    Family History  Problem Relation Age of Onset   Hypertension Mother    Diabetes Mother    Kidney disease Maternal Grandmother     Past Surgical History:  Procedure Laterality Date   CESAREAN SECTION      CESAREAN SECTION WITH BILATERAL TUBAL LIGATION Bilateral 05/26/2018   Procedure: CESAREAN SECTION WITH BILATERAL TUBAL LIGATION;  Surgeon: Verlyn Goad, MD;  Location: [facility];  Service: Obstetrics;  Laterality: Bilateral;   TUBAL LIGATION      ROS: Review of Systems Negative except as stated above  PHYSICAL EXAM: BP (!) 137/95   Pulse 83   Temp 97.9 F (36.6 C) (Oral)   Resp 16   Ht 5\' 1"  (1.549 m)   Wt 205 lb (93 kg)   LMP 09/21/2023 (Exact Date)   SpO2 98%   BMI 38.73 kg/m   Physical Exam HENT:     Head: Normocephalic and atraumatic.     Right Ear: Tympanic membrane, ear canal and external ear normal.     Left Ear: Tympanic membrane, ear canal and external ear normal.     Nose: Nose normal.     Mouth/Throat:     Mouth: Mucous membranes are moist.     Pharynx: Oropharynx is clear.  Eyes:     Extraocular Movements: Extraocular movements intact.     Conjunctiva/sclera: Conjunctivae normal.     Pupils: Pupils are equal, round, and reactive to light.  Neck:     Thyroid: No thyroid mass, thyromegaly or thyroid tenderness.  Cardiovascular:     Rate and Rhythm: Normal rate and regular rhythm.     Pulses: Normal pulses.     Heart sounds: Normal heart sounds.  Pulmonary:     Effort: Pulmonary effort is normal.     Breath sounds: Normal breath sounds.  Chest:     Comments: Patient declined. Abdominal:     General: Bowel sounds are normal.     Palpations: Abdomen is soft.  Genitourinary:    Comments: Patient declined. Musculoskeletal:        General: Normal range of motion.     Right shoulder: Normal.     Left shoulder: Normal.     Right upper arm: Normal.     Left upper arm: Normal.     Right elbow: Normal.     Left elbow: Normal.     Right forearm: Normal.     Left forearm: Normal.     Right wrist: Normal.     Left wrist: Normal.     Right hand: Normal.     Left hand: Normal.     Cervical back: Normal, normal range of motion and neck supple.      Thoracic back: Normal.     Lumbar back: Normal.     Right hip: Normal.     Left hip: Normal.     Right upper leg: Normal.     Left upper leg: Normal.     Right knee: Normal.     Left knee:  Normal.     Right lower leg: Normal.     Left lower leg: Normal.     Right ankle: Normal.     Left ankle: Normal.     Right foot: Normal.     Left foot: Normal.  Skin:    General: Skin is warm and dry.     Capillary Refill: Capillary refill takes less than 2 seconds.  Neurological:     General: No focal deficit present.     Mental Status: She is alert and oriented to person, place, and time.  Psychiatric:        Mood and Affect: Mood normal.        Behavior: Behavior normal.     ASSESSMENT AND PLAN: 1. Annual physical exam (Primary) - Counseled on 150 minutes of exercise per week, healthy eating (including decreased daily intake of saturated fats, cholesterol, added sugars, sodium), STI prevention, routine healthcare maintenance.  2. Screening for metabolic disorder - Routine screening.  - CMP14+EGFR  3. Screening for deficiency anemia - Routine screening.  - CBC  4. Diabetes mellitus screening - Routine screening.  - Hemoglobin A1c  5. Screening cholesterol level - Routine screening.  - Lipid panel  6. Thyroid disorder screen - Routine screening.  - TSH  7. Elevated blood pressure reading - Blood pressure not at goal during today's visit. Patient asymptomatic without chest pressure, chest pain, palpitations, shortness of breath, worst headache of life, and any additional red flag symptoms. - Follow-up with primary provider in 4 weeks or sooner if needed.  8. Encounter for completion of form with patient - [facility] Physical Examination form completed.   Patient was given the opportunity to ask questions.  Patient verbalized understanding of the plan and was able to repeat key elements of the plan. Patient was given clear instructions to go to Emergency  Department or return to medical center if symptoms don't improve, worsen, or new problems develop.The patient verbalized understanding.   Orders Placed This Encounter  Procedures   CBC   Lipid panel   CMP14+EGFR   Hemoglobin A1c   TSH    Return in about 1 year (around 09/21/2024) for Physical per patient preference.  Senaida Dama, NP

## 2023-09-22 NOTE — Progress Notes (Signed)
 Concern about b/p

## 2023-09-23 LAB — CMP14+EGFR
ALT: 15 IU/L (ref 0–32)
AST: 16 IU/L (ref 0–40)
Albumin: 4.3 g/dL (ref 3.9–4.9)
Alkaline Phosphatase: 75 IU/L (ref 44–121)
BUN/Creatinine Ratio: 10 (ref 9–23)
BUN: 9 mg/dL (ref 6–20)
Bilirubin Total: 0.3 mg/dL (ref 0.0–1.2)
CO2: 21 mmol/L (ref 20–29)
Calcium: 9.3 mg/dL (ref 8.7–10.2)
Chloride: 101 mmol/L (ref 96–106)
Creatinine, Ser: 0.87 mg/dL (ref 0.57–1.00)
Globulin, Total: 2.8 g/dL (ref 1.5–4.5)
Glucose: 96 mg/dL (ref 70–99)
Potassium: 4.4 mmol/L (ref 3.5–5.2)
Sodium: 140 mmol/L (ref 134–144)
Total Protein: 7.1 g/dL (ref 6.0–8.5)
eGFR: 90 mL/min/{1.73_m2} (ref >59)

## 2023-09-23 LAB — CBC
Hematocrit: 39.9 % (ref 34.0–46.6)
Hemoglobin: 12.1 g/dL (ref 11.1–15.9)
MCH: 26.2 pg — ABNORMAL LOW (ref 26.6–33.0)
MCHC: 30.3 g/dL — ABNORMAL LOW (ref 31.5–35.7)
MCV: 87 fL (ref 79–97)
Platelets: 401 10*3/uL (ref 150–450)
RBC: 4.61 x10E6/uL (ref 3.77–5.28)
RDW: 13.3 % (ref 11.7–15.4)
WBC: 7.9 10*3/uL (ref 3.4–10.8)

## 2023-09-23 LAB — HEMOGLOBIN A1C
Est. average glucose Bld gHb Est-mCnc: 108 mg/dL
Hgb A1c MFr Bld: 5.4 % (ref 4.8–5.6)

## 2023-09-23 LAB — LIPID PANEL
Chol/HDL Ratio: 3.7 ratio (ref 0.0–4.4)
Cholesterol, Total: 148 mg/dL (ref 100–199)
HDL: 40 mg/dL (ref >39)
LDL Chol Calc (NIH): 99 mg/dL (ref 0–99)
Triglycerides: 42 mg/dL (ref 0–149)
VLDL Cholesterol Cal: 9 mg/dL (ref 5–40)

## 2023-09-23 LAB — TSH: TSH: 1.3 u[IU]/mL (ref 0.450–4.500)

## 2023-09-25 ENCOUNTER — Ambulatory Visit: Payer: Self-pay | Admitting: Family
